# Patient Record
Sex: Male | Born: 1975 | Race: White | Hispanic: No | Marital: Married | Smoking: Current some day smoker
Health system: Southern US, Community
[De-identification: ages and names within clinical notes are randomized; demographics above are authoritative.]

## PROBLEM LIST (undated history)

## (undated) DIAGNOSIS — M51369 Other intervertebral disc degeneration, lumbar region without mention of lumbar back pain or lower extremity pain: Secondary | ICD-10-CM

## (undated) DIAGNOSIS — M5136 Other intervertebral disc degeneration, lumbar region: Secondary | ICD-10-CM

## (undated) DIAGNOSIS — IMO0002 Reserved for concepts with insufficient information to code with codable children: Secondary | ICD-10-CM

## (undated) DIAGNOSIS — M5126 Other intervertebral disc displacement, lumbar region: Secondary | ICD-10-CM

## (undated) DIAGNOSIS — M75100 Unspecified rotator cuff tear or rupture of unspecified shoulder, not specified as traumatic: Secondary | ICD-10-CM

## (undated) DIAGNOSIS — M4800 Spinal stenosis, site unspecified: Secondary | ICD-10-CM

## (undated) HISTORY — PX: ELBOW FRACTURE SURGERY: SHX616

## (undated) HISTORY — PX: ABDOMINAL SURGERY: SHX537

## (undated) HISTORY — PX: SHOULDER SURGERY: SHX246

---

## 2013-09-08 ENCOUNTER — Emergency Department (HOSPITAL_COMMUNITY)
Admission: EM | Admit: 2013-09-08 | Discharge: 2013-09-08 | Disposition: A | Discharge status: Home / Self Care | Payer: Self-pay | Attending: Emergency Medicine | Admitting: Emergency Medicine

## 2013-09-08 ENCOUNTER — Encounter (HOSPITAL_COMMUNITY): Payer: Self-pay | Admitting: Emergency Medicine

## 2013-09-08 DIAGNOSIS — G8929 Other chronic pain: Secondary | ICD-10-CM | POA: Insufficient documentation

## 2013-09-08 DIAGNOSIS — IMO0002 Reserved for concepts with insufficient information to code with codable children: Secondary | ICD-10-CM | POA: Insufficient documentation

## 2013-09-08 HISTORY — DX: Spinal stenosis, site unspecified: M48.00

## 2013-09-08 HISTORY — DX: Other intervertebral disc degeneration, lumbar region without mention of lumbar back pain or lower extremity pain: M51.369

## 2013-09-08 HISTORY — DX: Other intervertebral disc displacement, lumbar region: M51.26

## 2013-09-08 HISTORY — DX: Reserved for concepts with insufficient information to code with codable children: IMO0002

## 2013-09-08 HISTORY — DX: Other intervertebral disc degeneration, lumbar region: M51.36

## 2013-09-08 MED ORDER — OXYCODONE-ACETAMINOPHEN 5-325 MG PO TABS: 1.0000 | ORAL_TABLET | Freq: Four times a day (QID) | ORAL | Status: DC | PRN

## 2013-09-08 MED ORDER — METHOCARBAMOL 750 MG PO TABS: 750.0000 mg | ORAL_TABLET | Freq: Three times a day (TID) | ORAL | Status: DC

## 2013-09-08 MED ORDER — KETOROLAC TROMETHAMINE 30 MG/ML IJ SOLN
30.0000 mg | Freq: Once | INTRAMUSCULAR | Status: AC
  Administered 2013-09-08: 30 mg via INTRAMUSCULAR
  Filled 2013-09-08: qty 1

## 2013-09-08 MED ORDER — OXYCODONE-ACETAMINOPHEN 5-325 MG PO TABS
2.0000 | ORAL_TABLET | Freq: Once | ORAL | Status: AC
  Administered 2013-09-08: 2 via ORAL
  Filled 2013-09-08: qty 2

## 2013-09-08 MED ORDER — METHOCARBAMOL 500 MG PO TABS
750.0000 mg | ORAL_TABLET | Freq: Once | ORAL | Status: AC
  Administered 2013-09-08: 750 mg via ORAL
  Filled 2013-09-08: qty 2

## 2013-09-08 NOTE — ED Provider Notes (Signed)
CSN: [id number]     Arrival date & time 09/08/13  1735 History  This chart was scribed for non-physician practitioner, Earley Favor, FNP,working with Vida Roller, MD, by Karle Plumber, ED Scribe.  This patient was seen in room WTR7/WTR7 and the patient's care was started at 8:05 PM.    Chief Complaint  Patient presents with  . Back Pain   The history is provided by the patient. No language interpreter was used.   HPI Comments:  Robert Spencer is a 37 y.o. male with h/o spinal stenosis and degenerative disc disease who presents to the Emergency Department complaining of excerebration of his chronic lower back pain. He states he last saw his doctor in pain management in [place] approximately three months ago. He is new to the area and needs a referral to the pain management clinic. He states he awakes at times and has pain, numbness and tingling in his legs that  improves once he starts moving around. He reports he sometimes takes Aleve with no relief. Denies trauma. He has not taken any medications today. He denies bowel or bladder incontinence.    Past Medical History  Diagnosis Date  . Spinal stenosis   . Degenerative disc disease, lumbar   . Herniated disc   . Ruptured lumbar disc    Past Surgical History  Procedure Laterality Date  . Elbow fracture surgery Right    No family history on file. History  Substance Use Topics  . Smoking status: Never Smoker   . Smokeless tobacco: Not on file  . Alcohol Use: Yes     Comment: socially    Review of Systems  Constitutional: Negative for chills.  Musculoskeletal: Positive for arthralgias, back pain and gait problem. Negative for joint swelling, myalgias and neck stiffness.  Skin: Negative for rash and wound.  Neurological: Positive for numbness. Negative for dizziness and weakness.  All other systems reviewed and are negative.    Allergies  Review of patient's allergies indicates no known allergies.  Home Medications  No  current outpatient prescriptions on file. Triage Vitals: BP 124/73  Pulse 75  Temp(Src) 98.3 F (36.8 C) (Oral)  Resp 17  SpO2 96% Physical Exam  Nursing note and vitals reviewed. Constitutional: He appears well-developed and well-nourished.  HENT:  Head: Normocephalic.  Eyes: Pupils are equal, round, and reactive to light.  Neck: Normal range of motion.  Cardiovascular: Normal rate and regular rhythm.   Pulmonary/Chest: Effort normal and breath sounds normal.  Musculoskeletal: He exhibits tenderness. He exhibits no edema.  Neurological: He is alert.  Skin: Skin is warm and dry. No erythema.    ED Course  Procedures (including critical care time) DIAGNOSTIC STUDIES: Oxygen Saturation is 96% on RA, adequate by my interpretation.   COORDINATION OF CARE: 8:12 PM- Will give list of resources to establish care with a primary care physician. Will give pain medication, muscle relaxer, and anti-inflammatory medication. Pt verbalizes understanding and agrees to plan.  Labs Review Labs Reviewed - No data to display Imaging Review No results found.  EKG Interpretation   None       MDM  No diagnosis found.   I personally performed the services described in this documentation, which was scribed in my presence. The recorded information has been reviewed and is accurate.   Arman Filter, NP 09/08/13 2033

## 2013-09-08 NOTE — ED Notes (Signed)
Pt from home reports that he has chronic back pain. Pt is from [place] and states that he was a pain clinic pt, but has been here for three mths and has not been seen in [place] because he has been told he needs a referral. Pt adds that when he woke up this am his leg were tingling and numb, but since has resolved. Pt is A&O and in NAD

## 2013-09-09 NOTE — ED Provider Notes (Signed)
Medical screening examination/treatment/procedure(s) were performed by non-physician practitioner and as supervising physician I was immediately available for consultation/collaboration.    Leiyah Maultsby D Edgerrin Correia, MD 09/09/13 1526 

## 2013-11-10 ENCOUNTER — Emergency Department (HOSPITAL_COMMUNITY)
Admission: EM | Admit: 2013-11-10 | Discharge: 2013-11-10 | Disposition: A | Discharge status: Home / Self Care | Payer: Self-pay | Attending: Emergency Medicine | Admitting: Emergency Medicine

## 2013-11-10 ENCOUNTER — Encounter (HOSPITAL_COMMUNITY): Payer: Self-pay | Admitting: Emergency Medicine

## 2013-11-10 DIAGNOSIS — IMO0001 Reserved for inherently not codable concepts without codable children: Secondary | ICD-10-CM | POA: Insufficient documentation

## 2013-11-10 DIAGNOSIS — M545 Low back pain, unspecified: Secondary | ICD-10-CM

## 2013-11-10 DIAGNOSIS — G8929 Other chronic pain: Secondary | ICD-10-CM | POA: Insufficient documentation

## 2013-11-10 DIAGNOSIS — M5412 Radiculopathy, cervical region: Secondary | ICD-10-CM | POA: Insufficient documentation

## 2013-11-10 DIAGNOSIS — M5416 Radiculopathy, lumbar region: Secondary | ICD-10-CM

## 2013-11-10 DIAGNOSIS — R110 Nausea: Secondary | ICD-10-CM | POA: Insufficient documentation

## 2013-11-10 DIAGNOSIS — IMO0002 Reserved for concepts with insufficient information to code with codable children: Secondary | ICD-10-CM | POA: Insufficient documentation

## 2013-11-10 DIAGNOSIS — M5410 Radiculopathy, site unspecified: Secondary | ICD-10-CM

## 2013-11-10 DIAGNOSIS — M542 Cervicalgia: Secondary | ICD-10-CM | POA: Insufficient documentation

## 2013-11-10 MED ORDER — IBUPROFEN 600 MG PO TABS: 600.0000 mg | ORAL_TABLET | Freq: Four times a day (QID) | ORAL | Status: DC | PRN

## 2013-11-10 MED ORDER — OXYCODONE-ACETAMINOPHEN 5-325 MG PO TABS: 1.0000 | ORAL_TABLET | ORAL | Status: DC | PRN

## 2013-11-10 MED ORDER — OXYCODONE-ACETAMINOPHEN 5-325 MG PO TABS
2.0000 | ORAL_TABLET | Freq: Once | ORAL | Status: AC
  Administered 2013-11-10: 2 via ORAL
  Filled 2013-11-10: qty 2

## 2013-11-10 MED ORDER — METHOCARBAMOL 500 MG PO TABS: 500.0000 mg | ORAL_TABLET | Freq: Two times a day (BID) | ORAL | Status: DC | PRN

## 2013-11-10 MED ORDER — DIAZEPAM 5 MG/ML IJ SOLN
5.0000 mg | Freq: Once | INTRAMUSCULAR | Status: AC
  Administered 2013-11-10: 5 mg via INTRAMUSCULAR

## 2013-11-10 MED ORDER — DIAZEPAM 5 MG/ML IJ SOLN
5.0000 mg | Freq: Once | INTRAMUSCULAR | Status: DC
  Filled 2013-11-10: qty 2

## 2013-11-10 MED ORDER — KETOROLAC TROMETHAMINE 60 MG/2ML IM SOLN
60.0000 mg | Freq: Once | INTRAMUSCULAR | Status: AC
  Administered 2013-11-10: 60 mg via INTRAMUSCULAR
  Filled 2013-11-10: qty 2

## 2013-11-10 NOTE — ED Notes (Signed)
Pt c/o back pain w/ hx of same due to injury.

## 2013-11-10 NOTE — ED Provider Notes (Signed)
CSN: [id number]     Arrival date & time 11/10/13  1056 History   First MD Initiated Contact with Patient 11/10/13 1105     Chief Complaint  Patient presents with  . Back Pain   (Consider location/radiation/quality/duration/timing/severity/associated sxs/prior Treatment) HPI Pt is a 37yo male with hx of spinal stenosis and DDD in lumbar region c/o exacerbation of chronic low back pain and now reports right sided neck pain and right arm pain with numbness. States he recently moved from [facility] where he went to pain management, however is not going to have insurance until the new year, has been unable to establish PCP for referral to pain management. 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  Pain is constant, aching with sharp intermittent pain, associated with intermittent right arm numbness and bilateral leg numbness. States he knows he needs surgery at some point but he is scared of the anesthesia part of surgery.  Denies fever, n/v/d. Denies change in bowel or bladder habits.  Pt has asked if we have a TENS unit in ED as he states that has helped greatly in past.  Past Medical History  Diagnosis Date  . Spinal stenosis   . Degenerative disc disease, lumbar   . Herniated disc   . Ruptured lumbar disc    Past Surgical History  Procedure Laterality Date  . Elbow fracture surgery Right    No family history on file. History  Substance Use Topics  . Smoking status: Never Smoker   . Smokeless tobacco: Not on file  . Alcohol Use: Yes     Comment: socially    Review of Systems  Constitutional: Negative for fever and chills.  Gastrointestinal: Positive for nausea. Negative for vomiting, diarrhea and constipation.  Musculoskeletal: Positive for back pain, myalgias and neck pain ( right side). Negative for neck stiffness.  Skin: Negative for rash.  All other systems reviewed and are negative.    Allergies  Review of patient's allergies indicates no known  allergies.  Home Medications   Current Outpatient Rx  Name  Route  Sig  Dispense  Refill  . ibuprofen (ADVIL,MOTRIN) 600 MG tablet   Oral   Take 1 tablet (600 mg total) by mouth every 6 (six) hours as needed.   30 tablet   0   . methocarbamol (ROBAXIN) 500 MG tablet   Oral   Take 1 tablet (500 mg total) by mouth 2 (two) times daily as needed for muscle spasms.   20 tablet   0   . oxyCODONE-acetaminophen (PERCOCET/ROXICET) 5-325 MG per tablet   Oral   Take 1-2 tablets by mouth every 4 (four) hours as needed for severe pain.   15 tablet   0    BP 115/83  Pulse 66  Temp(Src) 97.5 F (36.4 C) (Oral)  Resp 18  SpO2 98% Physical Exam  Nursing note and vitals reviewed. Constitutional: He appears well-developed and well-nourished.  Pt standing in exam room, right arm up with hand on his hip, pt appears uncomfortable.  HENT:  Head: Normocephalic and atraumatic.  Eyes: Conjunctivae are normal. No scleral icterus.  Neck: Normal range of motion.  Cardiovascular: Normal rate, regular rhythm and normal heart sounds.   Pulmonary/Chest: Effort normal and breath sounds normal. No respiratory distress. He has no wheezes. He has no rales. He exhibits no tenderness.  Abdominal: Soft. Bowel sounds are normal. He exhibits no distension and no mass. There is no tenderness. There is no rebound and no  guarding.  Musculoskeletal: Normal range of motion. He exhibits tenderness. He exhibits no edema.  Tenderness in right upper trapezius, pain and numbness with full abduction of right arm. 5/5 grip strength bilaterally. Tenderness along lower lumbar spine and musculature. No step offs or crepitus.   Neurological: He is alert.  Antalgic gait  Skin: Skin is warm and dry.    ED Course  Procedures (including critical care time) Labs Review Labs Reviewed - No data to display Imaging Review No results found.  EKG Interpretation   None       MDM   1. Chronic low back pain   2.  Radiculopathy of lumbar region   3. Radiculopathy of arm    Pt with chronic pain w/o red flag symptoms, recently moved to area.  Pt in process of getting medical insurance for PCP and pain management referral.   Tenderness in right upper trapezius and lower lumbar region. Antalgic gait.Tx in ED: valium, oxycodone, and toradol.  Rx: robaxin, percocet, ibuprofen. Discussed use of TENS unit and advised pt look at local pharmacy and online for home TENS units.  Resource guide provided. Return precautions provided.    Junius Finner, PA-C 11/10/13 1221

## 2013-11-10 NOTE — ED Provider Notes (Signed)
Medical screening examination/treatment/procedure(s) were performed by non-physician practitioner and as supervising physician I was immediately available for consultation/collaboration.  Shanna Cisco, MD 11/10/13 [phone]

## 2013-12-20 ENCOUNTER — Emergency Department (HOSPITAL_COMMUNITY): Payer: Self-pay

## 2013-12-20 ENCOUNTER — Emergency Department (HOSPITAL_COMMUNITY)
Admission: EM | Admit: 2013-12-20 | Discharge: 2013-12-20 | Disposition: A | Discharge status: Home / Self Care | Payer: Self-pay | Attending: Emergency Medicine | Admitting: Emergency Medicine

## 2013-12-20 ENCOUNTER — Encounter (HOSPITAL_COMMUNITY): Payer: Self-pay | Admitting: Emergency Medicine

## 2013-12-20 DIAGNOSIS — G8929 Other chronic pain: Secondary | ICD-10-CM | POA: Insufficient documentation

## 2013-12-20 DIAGNOSIS — M25519 Pain in unspecified shoulder: Secondary | ICD-10-CM | POA: Insufficient documentation

## 2013-12-20 DIAGNOSIS — M25511 Pain in right shoulder: Secondary | ICD-10-CM

## 2013-12-20 DIAGNOSIS — R1110 Vomiting, unspecified: Secondary | ICD-10-CM | POA: Insufficient documentation

## 2013-12-20 DIAGNOSIS — IMO0001 Reserved for inherently not codable concepts without codable children: Secondary | ICD-10-CM | POA: Insufficient documentation

## 2013-12-20 MED ORDER — HYDROCODONE-ACETAMINOPHEN 5-325 MG PO TABS: 1.0000 | ORAL_TABLET | ORAL | Status: DC | PRN

## 2013-12-20 NOTE — ED Notes (Signed)
Pt. Upset stating "This vicodin will not work, I need percocet. That is what they gave me last time." PA notified. No change in patient medications made.

## 2013-12-20 NOTE — Discharge Instructions (Signed)
Take Vicodin for severe pain only. No driving or operating heavy machinery while taking vicodin. This medication may cause drowsiness. Wear sling for comfort. Continue to ice. Follow up with orthopedics.  Shoulder Pain The shoulder is the joint that connects your arms to your body. The bones that form the shoulder joint include the upper arm bone (humerus), the shoulder blade (scapula), and the collarbone (clavicle). The top of the humerus is shaped like a ball and fits into a rather flat socket on the scapula (glenoid cavity). A combination of muscles and strong, fibrous tissues that connect muscles to bones (tendons) support your shoulder joint and hold the ball in the socket. Small, fluid-filled sacs (bursae) are located in different areas of the joint. They act as cushions between the bones and the overlying soft tissues and help reduce friction between the gliding tendons and the bone as you move your arm. Your shoulder joint allows a wide range of motion in your arm. This range of motion allows you to do things like scratch your back or throw a ball. However, this range of motion also makes your shoulder more prone to pain from overuse and injury. Causes of shoulder pain can originate from both injury and overuse and usually can be grouped in the following four categories:  Redness, swelling, and pain (inflammation) of the tendon (tendinitis) or the bursae (bursitis).  Instability, such as a dislocation of the joint.  Inflammation of the joint (arthritis).  Broken bone (fracture). HOME CARE INSTRUCTIONS   Apply ice to the sore area.  Put ice in a plastic bag.  Place a towel between your skin and the bag.  Leave the ice on for 15-20 minutes, 03-04 times per day for the first 2 days.  Stop using cold packs if they do not help with the pain.  If you have a shoulder sling or immobilizer, wear it as long as your caregiver instructs. Only remove it to shower or bathe. Move your arm as  little as possible, but keep your hand moving to prevent swelling.  Squeeze a soft ball or foam pad as much as possible to help prevent swelling.  Only take over-the-counter or prescription medicines for pain, discomfort, or fever as directed by your caregiver. SEEK MEDICAL CARE IF:   Your shoulder pain increases, or new pain develops in your arm, hand, or fingers.  Your hand or fingers become cold and numb.  Your pain is not relieved with medicines. SEEK IMMEDIATE MEDICAL CARE IF:   Your arm, hand, or fingers are numb or tingling.  Your arm, hand, or fingers are significantly swollen or turn white or blue. MAKE SURE YOU:   Understand these instructions.  Will watch your condition.  Will get help right away if you are not doing well or get worse. Document Released: 08/25/2005 Document Revised: 08/09/2012 Document Reviewed: 10/30/2011 [facility] Patient Information 2014 [employer].

## 2013-12-20 NOTE — ED Notes (Signed)
Pt reports history of R shoulder pain. Denies any known injuries to the shoulder. He tried acupuncture with no relief. Reports pain persists and is getting worse. Cms intact

## 2013-12-20 NOTE — ED Provider Notes (Signed)
CSN: [id number]     Arrival date & time 12/20/13  1551 History  This chart was scribed for non-physician practitioner Johnnette Gourdobyn Albert, PA-C, working with Joya Gaskinsonald W Wickline, MD by Ronal Fearuke Okeke, ED scribe. This patient was seen in room TR07C/TR07C and the patient's care was started at 5:15 PM.    Chief Complaint  Patient presents with  . Shoulder Pain   (Consider location/radiation/quality/duration/timing/severity/associated sxs/prior Treatment) Patient is a 38 y.o. male presenting with shoulder pain. The history is provided by the patient. No language interpreter was used.  Shoulder Pain   HPI Comments: Robert Spencer is a 38 y.o. male who presents to the Emergency Department complaining of chronic right shoulder pain that has grown worse over the past 4x days. Pt's pain is keeping him from sleep and has caused him to experience an episode of emesis.  Pt states that when he leans forward his right arm goes numb. Pt states that he cannot recall any specific injury to the affected area.  Pt has taken ibuprofen and Advil with no relief. Also got acupuncture in the past with mild relief. Pt was [place] by [place] doctor in [place] and advised to get an MRI. 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    Past Medical History  Diagnosis Date  . Spinal stenosis   . Degenerative disc disease, lumbar   . Herniated disc   . Ruptured lumbar disc    Past Surgical History  Procedure Laterality Date  . Elbow fracture surgery Right    History reviewed. No pertinent family history. History  Substance Use Topics  . Smoking status: Never Smoker   . Smokeless tobacco: Not on file  . Alcohol Use: Yes     Comment: socially    Review of Systems  Musculoskeletal: Positive for myalgias. Negative for back pain and neck pain.  All other systems reviewed and are negative.    Allergies  Review of patient's allergies indicates no known allergies.  Home Medications   Current  Outpatient Rx  Name  Route  Sig  Dispense  Refill  . Aspirin-Salicylamide-Caffeine (BC HEADACHE POWDER PO)   Oral   Take 1 packet by mouth daily as needed (for pain).         Marland Kitchen. ibuprofen (ADVIL,MOTRIN) 200 MG tablet   Oral   Take 800 mg by mouth every 6 (six) hours as needed for mild pain.         Marland Kitchen. HYDROcodone-acetaminophen (NORCO/VICODIN) 5-325 MG per tablet   Oral   Take 1-2 tablets by mouth every 4 (four) hours as needed.   10 tablet   0    BP 121/84  Pulse 49  Temp(Src) 97.5 F (36.4 C) (Oral)  Resp 16  Wt 218 lb 12.8 oz (99.247 kg)  SpO2 98% Physical Exam  Nursing note and vitals reviewed. Constitutional: He is oriented to person, place, and time. He appears well-developed and well-nourished. No distress.  HENT:  Head: Normocephalic and atraumatic.  Eyes: Conjunctivae and EOM are normal.  Neck: Normal range of motion. Neck supple.  Cardiovascular: Normal rate, regular rhythm and normal heart sounds.   Pulmonary/Chest: Effort normal and breath sounds normal.  Musculoskeletal: Normal range of motion. He exhibits tenderness. He exhibits no edema.  Tender to palpation over entire right should girdle; no deformity; decreased ROM due to pain  Neurological: He is alert and oriented to person, place, and time.  Sensation intact; normal grip strength; intact distal pulses  Skin: Skin  is warm and dry.  Psychiatric: He has a normal mood and affect. His behavior is normal.    ED Course  Procedures (including critical care time)  DIAGNOSTIC STUDIES: Oxygen Saturation is 98% on RA, normal by my interpretation.    COORDINATION OF CARE: 5:18 PM- Pt advised of plan for treatment including a sling for the right arm and pain medication. Pt will get a referral to an orthopedist and advised to get an MRI for an orthopedist and pt agrees.    Labs Review Labs Reviewed - No data to display Imaging Review Dg Shoulder Right  12/20/2013   CLINICAL DATA:  Pain.  EXAM: RIGHT  SHOULDER - 2+ VIEW  COMPARISON:  None.  FINDINGS: There is no evidence of fracture or dislocation. There is no evidence of arthropathy or other focal bone abnormality. Soft tissues are unremarkable.  IMPRESSION: Negative.   Electronically Signed   By: Maisie Fus  Register   On: 12/20/2013 17:11    EKG Interpretation   None       MDM   1. Shoulder pain, right    Neurovascularly intact. Probable rotator cuff injury. Has been seen for same in past, advised MRI. Sling given, pain contol, f/u with ortho. Return precautions given. Patient states understanding of treatment care plan and is agreeable.   I personally performed the services described in this documentation, which was scribed in my presence. The recorded information has been reviewed and is accurate.    Trevor Mace, PA-C 12/20/13 1727

## 2013-12-21 NOTE — ED Provider Notes (Signed)
Medical screening examination/treatment/procedure(s) were performed by non-physician practitioner and as supervising physician I was immediately available for consultation/collaboration.  EKG Interpretation   None         Joya Gaskinsonald W Eiko Mcgowen, MD 12/21/13 1118

## 2013-12-22 ENCOUNTER — Emergency Department (HOSPITAL_COMMUNITY)
Admission: EM | Admit: 2013-12-22 | Discharge: 2013-12-22 | Disposition: A | Discharge status: Home / Self Care | Payer: Self-pay | Attending: Emergency Medicine | Admitting: Emergency Medicine

## 2013-12-22 ENCOUNTER — Encounter (HOSPITAL_COMMUNITY): Payer: Self-pay | Admitting: Emergency Medicine

## 2013-12-22 DIAGNOSIS — R209 Unspecified disturbances of skin sensation: Secondary | ICD-10-CM | POA: Insufficient documentation

## 2013-12-22 DIAGNOSIS — M25511 Pain in right shoulder: Secondary | ICD-10-CM

## 2013-12-22 DIAGNOSIS — M25519 Pain in unspecified shoulder: Secondary | ICD-10-CM | POA: Insufficient documentation

## 2013-12-22 DIAGNOSIS — R110 Nausea: Secondary | ICD-10-CM | POA: Insufficient documentation

## 2013-12-22 MED ORDER — OXYCODONE-ACETAMINOPHEN 5-325 MG PO TABS: 2.0000 | ORAL_TABLET | ORAL | Status: DC | PRN

## 2013-12-22 MED ORDER — KETOROLAC TROMETHAMINE 60 MG/2ML IM SOLN
60.0000 mg | Freq: Once | INTRAMUSCULAR | Status: AC
  Administered 2013-12-22: 60 mg via INTRAMUSCULAR
  Filled 2013-12-22: qty 2

## 2013-12-22 MED ORDER — NAPROXEN SODIUM 220 MG PO TABS: 220.0000 mg | ORAL_TABLET | Freq: Two times a day (BID) | ORAL | Status: DC

## 2013-12-22 MED ORDER — CYCLOBENZAPRINE HCL 5 MG PO TABS: 5.0000 mg | ORAL_TABLET | Freq: Three times a day (TID) | ORAL | Status: DC | PRN

## 2013-12-22 MED ORDER — HYDROMORPHONE HCL PF 1 MG/ML IJ SOLN
1.0000 mg | Freq: Once | INTRAMUSCULAR | Status: AC
  Administered 2013-12-22: 1 mg via INTRAMUSCULAR
  Filled 2013-12-22: qty 1

## 2013-12-22 NOTE — Discharge Instructions (Signed)
Adhesive Capsulitis Sometimes the shoulder becomes stiff and is painful to move. Some people say it feels as if the shoulder is frozen in place. Because of this, the condition is called "frozen shoulder." Its medical name is adhesive capsulitis.  The shoulder joint is made up of strong connective tissue that attaches the ball of the humerus to the shallow shoulder socket. This strong connective tissue is called the joint capsule. This tissue can become stiff and swollen. That is when adhesive capsulitis sets in. CAUSES  It is not always clear just what the cause adhesive capsulitis. Possibilities include:  Injury to the shoulder joint.  Strain. This is a repetitive injury brought about by overuse.  Lack of use. Perhaps your arm or hand was otherwise injured. It might have been in a sling for awhile. Or perhaps you were not using it to avoid pain.  Referred pain. This is a sort of trick the body plays. You feel pain in the shoulder. But, the pain actually comes from an injury somewhere else in the body.  Long-standing health problems. Several diseases can cause adhesive capsulitis. They include diabetes, heart disease, stroke, thyroid problems, rheumatoid arthritis and lung disease.  Being a women older than 40. Anyone can develop adhesive capsulitis but it is most common in women in this age group. SYMPTOMS   Pain.  It occurs when the arm is moved.  Parts of the shoulder might hurt if they are touched.  Pain is worse at night or when resting.  Soreness. It might not be strong enough to be called pain. But, the shoulder aches.  The shoulder does not move freely.  Muscle spasms.  Trouble sleeping because of shoulder ache or pain. DIAGNOSIS  To decide if you have adhesive capsulitis, your healthcare provider will probably:  Ask about symptoms you have noticed.  Ask about your history of joint pain and anything that might have caused the pain.  Ask about your overall  health.  Use hands to feel your shoulder and neck.  Ask you to move your shoulder in specific directions. This may indicate the origin of the pain.  Order imaging tests; pictures of the shoulder. They help pinpoint the source of the problem. An X-ray might be used. For more detail, an MRI is often used. An MRI details the tendons, muscles and ligaments as well as the joint. TREATMENT  Adhesive capsulitis can be treated several ways. Most treatments can be done in a clinic or in your healthcare provider's office. Be sure to discuss the different options with your caregiver. They include:  Physical therapy. You will work on specific exercises to get your shoulder moving again. The exercises usually involve stretching. A physical therapist (a caregiver with special training) can show you what to do and what not to do. The exercises will need to be done daily.  Medication.  Over-the-counter medicines may relieve pain and inflammation (the body's way of reacting to injury or infection).  Corticosteroids. These are stronger drugs to reduce pain and inflammation. They are given by injection (shots) into the shoulder joint. Frequent treatment is not recommended.  Muscle relaxants. Medication may be prescribed to ease muscle spasms.  Treatment of underlying conditions. This means treating another condition that is causing your shoulder problem. This might be a rotator cuff (tendon) problem  Shoulder manipulation. The shoulder will be moved by your healthcare provider. You would be under general anesthesia (given a drug that puts you to sleep). You would not feel anything. Sometimes   the joint will be injected with salt water (saline) at high pressure to break down internal scarring in the joint capsule.  Surgery. This is rarely needed. It may be suggested in advanced cases after all other treatment has failed. PROGNOSIS  In time, most people recover from adhesive capsulitis. Sometimes, however, the  pain goes away but full movement of the shoulder does not return.  HOME CARE INSTRUCTIONS   Take any pain medications recommended by your healthcare provider. Follow the directions carefully.  If you have physical therapy, follow through with the therapist's suggestions. Be sure you understand the exercises you will be doing. You should understand:  How often the exercises should be done.  How many times each exercise should be repeated.  How long they should be done.  What other activities you should do, or not do.  That you should warm up before doing any exercise. Just 5 to 10 minutes will help. Small, gentle movements should get your shoulder ready for more.  Avoid high-demand exercise that involves your shoulder such as throwing. This type of exercise can make pain worse.  Consider using cold packs. Cold may ease swelling and pain. Ask your healthcare provider if a cold pack might help you. If so, get directions on how and when to use them. SEEK MEDICAL CARE IF:   You have any questions about your medications.  Your pain continues to increase. Document Released: 09/12/2009 Document Revised: 02/07/2012 Document Reviewed: 09/12/2009 [facility] Patient Information 2014 [place], [place].  Shoulder Pain The shoulder is the joint that connects your arms to your body. The bones that form the shoulder joint include the upper arm bone (humerus), the shoulder blade (scapula), and the collarbone (clavicle). The top of the humerus is shaped like a ball and fits into a rather flat socket on the scapula (glenoid cavity). A combination of muscles and strong, fibrous tissues that connect muscles to bones (tendons) support your shoulder joint and hold the ball in the socket. Small, fluid-filled sacs (bursae) are located in different areas of the joint. They act as cushions between the bones and the overlying soft tissues and help reduce friction between the gliding tendons and the bone as you move your  arm. Your shoulder joint allows a wide range of motion in your arm. This range of motion allows you to do things like scratch your back or throw a ball. However, this range of motion also makes your shoulder more prone to pain from overuse and injury. Causes of shoulder pain can originate from both injury and overuse and usually can be grouped in the following four categories:  Redness, swelling, and pain (inflammation) of the tendon (tendinitis) or the bursae (bursitis).  Instability, such as a dislocation of the joint.  Inflammation of the joint (arthritis).  Broken bone (fracture). HOME CARE INSTRUCTIONS   Apply ice to the sore area.  Put ice in a plastic bag.  Place a towel between your skin and the bag.  Leave the ice on for 15-20 minutes, 03-04 times per day for the first 2 days.  Stop using cold packs if they do not help with the pain.  If you have a shoulder sling or immobilizer, wear it as long as your caregiver instructs. Only remove it to shower or bathe. Move your arm as little as possible, but keep your hand moving to prevent swelling.  Squeeze a soft ball or foam pad as much as possible to help prevent swelling.  Only take over-the-counter or prescription  medicines for pain, discomfort, or fever as directed by your caregiver. SEEK MEDICAL CARE IF:   Your shoulder pain increases, or new pain develops in your arm, hand, or fingers.  Your hand or fingers become cold and numb.  Your pain is not relieved with medicines. SEEK IMMEDIATE MEDICAL CARE IF:   Your arm, hand, or fingers are numb or tingling.  Your arm, hand, or fingers are significantly swollen or turn white or blue. MAKE SURE YOU:   Understand these instructions.  Will watch your condition.  Will get help right away if you are not doing well or get worse. Document Released: 08/25/2005 Document Revised: 08/09/2012 Document Reviewed: 10/30/2011 [facility] Patient Information 2014 [place],  [place].   Emergency Department Resource Guide 1) Find a Doctor and Pay Out of Pocket Although you won't have to find out who is covered by your insurance plan, it is a good idea to ask around and get recommendations. You will then need to call the office and see if the doctor you have chosen will accept you as a new patient and what types of options they offer for patients who are self-pay. Some doctors offer discounts or will set up payment plans for their patients who do not have insurance, but you will need to ask so you aren't surprised when you get to your appointment.  2) Contact Your Local Health Department Not all health departments have doctors that can see patients for sick visits, but many do, so it is worth a call to see if yours does. If you don't know where your local health department is, you can check in your phone book. The CDC also has a tool to help you locate your state's health department, and many state websites also have listings of all of their local health departments.  3) Find a Walk-in Clinic If your illness is not likely to be very severe or complicated, you may want to try a walk in clinic. These are popping up all over the country in pharmacies, drugstores, and shopping centers. They're usually staffed by nurse practitioners or physician assistants that have been trained to treat common illnesses and complaints. They're usually fairly quick and inexpensive. However, if you have serious medical issues or chronic medical problems, these are probably not your best option.  No Primary Care Doctor: - Call Health Connect at  [phone] - they can help you locate a primary care doctor that  accepts your insurance, provides certain services, etc. - Physician Referral Service- [phone]  Chronic Pain Problems: Organization         Address  Phone   Notes  [facility]  [phone] Patients need to be referred by their primary care doctor.   Medication  Assistance: Organization         Address  Phone   Notes  [facility] Medication [facility] [address] [place], [place] [address] [phone] --Must be a resident of [facility] -- Must have NO insurance coverage whatsoever (no Medicaid/ Medicare, etc.) -- The pt. MUST have a primary care doctor that directs their care regularly and follows them in the community   [facility]  [phone]   [employer]  [phone]    Agencies that provide inexpensive medical care: Organization         Address  Phone   Notes  [facility]  [phone]   [facility]    [phone]  [facility] [address] [place], [place] [address] [phone]   [facility] [address] [place] [place] [phone]   Planned Parenthood    [phone]   [facility]    [phone]   [facility]  [address], [place] Phone:  [phone], Fax:  [phone] Hours of Operation:  9 am - 6 pm, M-F.  Also accepts Medicaid/Medicare and self-pay.  [facility]  [address], [place] Phone: [phone], Fax: [phone]. Hours of Operation:  8:30 am - 5:30 pm, M-F.  Also accepts Medicaid and self-pay.  [facility] [place] [address] [place] Phone: [phone]   [facility] [address] Natasha Bence [place], [place] [phone], Ext. [phone] Mondays & Thursdays: 7-9 AM.  First 15 patients are seen on a first come, first serve basis.    Medicaid-accepting [facility] Providers:  Organization         Address  Phone   Notes  [facility] [address], [place] [phone] Also accepts self-pay patients.  [facility] [address] Laurell Josephs [place], [place]  [phone]   [facility] [address], [place]  [phone]   [facility] [address] [place] [phone]   Renaye Rakers [address], [place]   [phone] Only accepts [place] patients after they have their name applied to their card.   Self-Pay (no insurance) in [facility]:  Organization         Address  Phone   Notes  Sickle Cell Patients, [facility] [address] [place], [place] [phone]   [facility] [address] [place], [place] [phone]   [facility] [place]  [address], [place] [phone]   [facility]/Dr. Osei-Bonsu  [address], [place] or [address], [place] [phone] Phone number for both [place] and [place] locations is the same.  Urgent Medical and [facility] [address] [place] [phone]   [facility] [address], [place] or [address] [phone]   [facility] [address] [place] [phone], phone; [phone], fax Sees patients 1st and 3rd Saturday of every month.  Must not qualify for public or private insurance (i.e. Medicaid, Medicare, [place] Health Choice, Veterans' Benefits)  Household income should be no more than 200% of the poverty level The clinic cannot treat you if you are pregnant or think you are pregnant  Sexually transmitted diseases are not treated at the clinic.    Dental Care: Organization         Address  Phone  Notes  [facility] Department of [facility] [address] [place], [place] [phone] Accepts children up to age 34 who are enrolled in [place] or [place] Health Choice; pregnant women with a Medicaid card; and children who have applied for Medicaid or [place] Health Choice, but were declined, whose parents can pay a reduced fee at time of service.  [facility]  Department of [facility]  [address], [place] [phone] Accepts children up to age 22 who  are enrolled in Medicaid or [place] Health Choice; pregnant women with a Medicaid card; and children who have applied for Medicaid or [place] Health Choice, but were declined, whose parents can pay a reduced fee at time of service.  [facility]  [address] [place], [place] [phone] Patients are seen by appointment only. Walk-ins are not accepted. [facility] will see patients 55 years of age and older. Monday - Tuesday (8am-5pm) Most Wednesdays (8:30-5pm) $30 per visit, cash only  [facility] Adult [facility] Access [facility]  [address], [facility] [phone] Patients are seen by appointment only. Walk-ins are not accepted. [facility] will see patients 3 years of age and older. One Wednesday Evening (Monthly: Volunteer Based).  $30 per visit, cash only  [employer]  [phone] for adults; Children under age 52, call Graduate Pediatric Dentistry at [phone]. Children aged 14-14, please call [phone] to request a pediatric application.  Dental services are provided in all areas of dental care including fillings, crowns and bridges, complete and partial dentures, implants, gum treatment, root canals, and extractions. Preventive care is also provided. Treatment is provided to both adults and children. Patients are selected via a lottery and there is often a waiting list.   [facility] [address], [place]  [phone] [email or web address]   [facility] [address] [place], [place] [phone], Ext. [phone] Second and Fourth Thursday of each month, opens at 6:30 AM; Clinic ends at 9 AM.  Patients are seen on a first-come first-served basis, and a limited number are seen during each clinic.   [facility]  [address] Ether Griffins [place], [place] [phone]    Eligibility Requirements You must have lived in [place], [place], or [place] counties for at least the last three months.   You cannot be eligible for state or federal sponsored [place], including [employer], [place], or [employer].   You generally cannot be eligible for healthcare insurance through your employer.    How to apply: Eligibility screenings are held every Tuesday and Wednesday afternoon from 1:00 pm until 4:00 pm. You do not need an appointment for the interview!  [facility] [address], [place], [place] [phone]   [facility] Health Department  [phone]   [facility] Health Department  [phone]   [facility] Department  [phone]    Behavioral Health Resources in the Community: Intensive Outpatient Programs Organization         Address  Phone  Notes  [facility] [address], [place], [place] [phone]   [facility] Outpatient [address] [place], [place] [phone]   [facility] [address], [place], [place]  [phone]   [facility] Mental Health [address]  [place], [place] [phone] or [phone]   Substance Abuse Resources Organization         Address  Phone  Notes  [facility]  [phone]   [facility]  [phone]   [facility]  [phone]   Floydene Flock  [phone]   Residential & Outpatient Substance Abuse Program  [phone]   Psychological Services Organization         Address  Phone  Notes  [facility] Behavioral Health  [phone]   [facility]  [phone]   [facility] Mental Health [address], [place]  [phone] or [phone]    Mobile Crisis Teams Organization         Address  Phone  Notes  [facility], Mobile Crisis Care Unit  [phone]   [facility]  [address]  [place], [place] [phone]   [facility] [address] [place] [phone]    Self-Help/Support Groups Organization         Address  Phone             Notes  [facility] [place] - variety of support groups  336- I7437963 Call for more information  Narcotics Anonymous (NA), [facility] [address], [employer] [place]  2 meetings at this location   Statistician         Address  Phone  Notes  [facility] [address],    [place]  [phone]   [facility]  [address] [place] [address], [place], [place] [phone]   [facility] [address] [place], [place] [phone] Admissions: 8am-3pm M-F  [facility] [address],    [place], [place] [phone]   [facility] [address] [place], [place], [place] [phone]   [facility] [address]  [place], [place] [phone]   [facility] - Intensive Outpatient [address] Laurell Josephs [address], [place], [place] [phone]   [facility] ([facility] [address] [place]  [place], [place] [phone] or [phone]   [facility] ([facility]) [address], [place], [place] [phone] Accepts Medicaid  [facility] [address]  [place] [phone] Substance Abuse/Addiction Treatment   [facility] Organization         Address  Phone  Notes  [facility]  [phone]   Angie Fava, PhD [address] Ervin Knack [place], [place]   [phone] or [phone]   [facility] Behavioral   [address] [place], [place] [phone]   [facility] [address] [place] [address], [place], [place] [phone] Insurance/Medicaid/sponsorship through [facility] and Families [address]                                    [place], [place] [phone] Therapy/tele-psych/case    [facility] [address], [place] [phone]    Dr. Lolly Mustache  [phone]   [facility]  [employer] [facility] Dept. 1) [address], [place] 2) [address] [place] 3)  [address], [place] [phone]  [phone]   [facility] Child Abuse Hotline (484)254-5575 or [phone] (After Hours)

## 2013-12-22 NOTE — ED Provider Notes (Signed)
CSN: [id number]     Arrival date & time 12/22/13  1130 History   First MD Initiated Contact with Patient 12/22/13 1209     Chief Complaint  Patient presents with  . Shoulder Pain   (Consider location/radiation/quality/duration/timing/severity/associated sxs/prior Treatment) Patient is a 38 y.o. male presenting with shoulder pain.  Shoulder Pain This is a new problem. Episode onset: 1 week. The problem occurs constantly. The problem has been gradually worsening. Pertinent negatives include no chest pain and no shortness of breath. Associated symptoms comments: No fevers.  Positional tingling in right hand when leaning forward.. Exacerbated by: movement. Nothing relieves the symptoms. Treatments tried: vicodin, ibuprofen. The treatment provided no relief.    Past Medical History  Diagnosis Date  . Spinal stenosis   . Degenerative disc disease, lumbar   . Herniated disc   . Ruptured lumbar disc    Past Surgical History  Procedure Laterality Date  . Elbow fracture surgery Right    History reviewed. No pertinent family history. History  Substance Use Topics  . Smoking status: Never Smoker   . Smokeless tobacco: Not on file  . Alcohol Use: Yes     Comment: socially    Review of Systems  Constitutional: Negative for fever.  Respiratory: Negative for cough and shortness of breath.   Cardiovascular: Negative for chest pain.  Gastrointestinal: Positive for nausea.  All other systems reviewed and are negative.    Allergies  Review of patient's allergies indicates no known allergies.  Home Medications   Current Outpatient Rx  Name  Route  Sig  Dispense  Refill  . Aspirin-Salicylamide-Caffeine (BC HEADACHE POWDER PO)   Oral   Take 1 packet by mouth daily as needed (for pain).         Marland Kitchen HYDROcodone-acetaminophen (NORCO/VICODIN) 5-325 MG per tablet   Oral   Take 1-2 tablets by mouth every 4 (four) hours as needed.   10 tablet   0   . ibuprofen (ADVIL,MOTRIN) 200 MG  tablet   Oral   Take 800 mg by mouth every 6 (six) hours as needed for mild pain.          BP 127/80  Pulse 73  Temp(Src) 97.7 F (36.5 C) (Oral)  Resp 18  SpO2 96% Physical Exam  Nursing note and vitals reviewed. Constitutional: He is oriented to person, place, and time. He appears well-developed and well-nourished. No distress.  HENT:  Head: Normocephalic and atraumatic.  Eyes: Conjunctivae are normal. No scleral icterus.  Neck: Neck supple.  Cardiovascular: Normal rate and intact distal pulses.   Pulmonary/Chest: Effort normal. No stridor. No respiratory distress.  Abdominal: Normal appearance. He exhibits no distension.  Musculoskeletal:       Right shoulder: He exhibits tenderness. He exhibits normal range of motion (pain with active and passive ROM), no swelling, no effusion, no crepitus, no deformity, no laceration, normal pulse and normal strength.  Neurological: He is alert and oriented to person, place, and time.  Skin: Skin is warm and dry. No rash noted.  Psychiatric: He has a normal mood and affect. His behavior is normal.    ED Course  Procedures (including critical care time) Labs Review Labs Reviewed - No data to display Imaging Review Dg Shoulder Right  12/20/2013   CLINICAL DATA:  Pain.  EXAM: RIGHT SHOULDER - 2+ VIEW  COMPARISON:  None.  FINDINGS: There is no evidence of fracture or dislocation. There is no evidence of arthropathy or other focal bone abnormality. Soft tissues  are unremarkable.  IMPRESSION: Negative.   Electronically Signed   By: Maisie Fushomas  Register   On: 12/20/2013 17:11    EKG Interpretation   None       MDM   1. Right shoulder pain    38 yo male with right shoulder pain.  No known injury, but has had off and on pain in this shoulder in the past.  Evaluated 2 days ago in ED and had negative x rays.  Pain appears musculoskeletal.  Good pulses, good strength, good sensation distally.  No evidence of septic joint.  Given IM dilaudid and  IM toradol.  Will dc with percocet, NSAIDs, and other supportive care until he can see an orthopedist.  Also rec'd outpt PT eval.      Candyce ChurnJohn David Deborha Moseley, MD 12/22/13 [phone]

## 2013-12-22 NOTE — ED Notes (Signed)
Pt from home c/o shoulder pain, denies injury. [facility] same. He reports having spasms and his pain is uncontrolled with Vicodin perscribed.

## 2014-02-26 ENCOUNTER — Emergency Department (INDEPENDENT_AMBULATORY_CARE_PROVIDER_SITE_OTHER)
Admission: EM | Admit: 2014-02-26 | Discharge: 2014-02-26 | Disposition: A | Discharge status: Home / Self Care | Payer: Self-pay | Source: Home / Self Care

## 2014-02-26 ENCOUNTER — Encounter (HOSPITAL_COMMUNITY): Payer: Self-pay | Admitting: Emergency Medicine

## 2014-02-26 DIAGNOSIS — S39012A Strain of muscle, fascia and tendon of lower back, initial encounter: Secondary | ICD-10-CM

## 2014-02-26 DIAGNOSIS — M25519 Pain in unspecified shoulder: Secondary | ICD-10-CM

## 2014-02-26 DIAGNOSIS — M25511 Pain in right shoulder: Secondary | ICD-10-CM

## 2014-02-26 DIAGNOSIS — G8929 Other chronic pain: Secondary | ICD-10-CM

## 2014-02-26 DIAGNOSIS — S335XXA Sprain of ligaments of lumbar spine, initial encounter: Secondary | ICD-10-CM

## 2014-02-26 MED ORDER — DICLOFENAC POTASSIUM 50 MG PO TABS: 50.0000 mg | ORAL_TABLET | Freq: Three times a day (TID) | ORAL | Status: DC

## 2014-02-26 MED ORDER — HYDROCODONE-ACETAMINOPHEN 5-325 MG PO TABS: 1.0000 | ORAL_TABLET | ORAL | Status: DC | PRN

## 2014-02-26 NOTE — Discharge Instructions (Signed)
Chronic Pain Chronic pain can be defined as pain that is off and on and lasts for 3 6 months or longer. Many things cause chronic pain, which can make it difficult to make a diagnosis. There are many treatment options available for chronic pain. However, finding a treatment that works well for you may require trying various approaches until the right one is found. Many people benefit from a combination of two or more types of treatment to control their pain. SYMPTOMS  Chronic pain can occur anywhere in the body and can range from mild to very severe. Some types of chronic pain include:  Headache.  Low back pain.  Cancer pain.  Arthritis pain.  Neurogenic pain. This is pain resulting from damage to nerves. People with chronic pain may also have other symptoms such as:  Depression.  Anger.  Insomnia.  Anxiety. DIAGNOSIS  Your health care provider will help diagnose your condition over time. In many cases, the initial focus will be on excluding possible conditions that could be causing the pain. Depending on your symptoms, your health care provider may order tests to diagnose your condition. Some of these tests may include:   Blood tests.   CT scan.   MRI.   X-rays.   Ultrasounds.   Nerve conduction studies.  You may need to see a specialist.  TREATMENT  Finding treatment that works well may take time. You may be referred to a pain specialist. He or she may prescribe medicine or therapies, such as:   Mindful meditation or yoga.  Shots (injections) of numbing or pain-relieving medicines into the spine or area of pain.  Local electrical stimulation.  Acupuncture.   Massage therapy.   Aroma, color, light, or sound therapy.   Biofeedback.   Working with a physical therapist to keep from getting stiff.   Regular, gentle exercise.   Cognitive or behavioral therapy.   Group support.  Sometimes, surgery may be recommended.  HOME CARE INSTRUCTIONS    Take all medicines as directed by your health care provider.   Lessen stress in your life by relaxing and doing things such as listening to calming music.   Exercise or be active as directed by your health care provider.   Eat a healthy diet and include things such as vegetables, fruits, fish, and lean meats in your diet.   Keep all follow-up appointments with your health care provider.   Attend a support group with others suffering from chronic pain. SEEK MEDICAL CARE IF:   Your pain gets worse.   You develop a new pain that was not there before.   You cannot tolerate medicines given to you by your health care provider.   You have new symptoms since your last visit with your health care provider.  SEEK IMMEDIATE MEDICAL CARE IF:   You feel weak.   You have decreased sensation or numbness.   You lose control of bowel or bladder function.   Your pain suddenly gets much worse.   You develop shaking.  You develop chills.  You develop confusion.  You develop chest pain.  You develop shortness of breath.  MAKE SURE YOU:  Understand these instructions.  Will watch your condition.  Will get help right away if you are not doing well or get worse. Document Released: 08/07/2002 Document Revised: 07/18/2013 Document Reviewed: 05/11/2013 [facility] Patient Information 2014 [place], [place].  Lumbosacral Strain Lumbosacral strain is a strain of any of the parts that make up your lumbosacral vertebrae. Your  lumbosacral vertebrae are the bones that make up the lower third of your backbone. Your lumbosacral vertebrae are held together by muscles and tough, fibrous tissue (ligaments).  CAUSES  A sudden blow to your back can cause lumbosacral strain. Also, anything that causes an excessive stretch of the muscles in the low back can cause this strain. This is typically seen when people exert themselves strenuously, fall, lift heavy objects, bend, or crouch  repeatedly. RISK FACTORS  Physically demanding work.  Participation in pushing or pulling sports or sports that require sudden twist of the back (tennis, golf, baseball).  Weight lifting.  Excessive lower back curvature.  Forward-tilted pelvis.  Weak back or abdominal muscles or both.  Tight hamstrings. SIGNS AND SYMPTOMS  Lumbosacral strain may cause pain in the area of your injury or pain that moves (radiates) down your leg.  DIAGNOSIS Your health care provider can often diagnose lumbosacral strain through a physical exam. In some cases, you may need tests such as X-ray exams.  TREATMENT  Treatment for your lower back injury depends on many factors that your clinician will have to evaluate. However, most treatment will include the use of anti-inflammatory medicines. HOME CARE INSTRUCTIONS   Avoid hard physical activities (tennis, racquetball, waterskiing) if you are not in proper physical condition for it. This may aggravate or create problems.  If you have a back problem, avoid sports requiring sudden body movements. Swimming and walking are generally safer activities.  Maintain good posture.  Maintain a healthy weight.  For acute conditions, you may put ice on the injured area.  Put ice in a plastic bag.  Place a towel between your skin and the bag.  Leave the ice on for 20 minutes, 2 3 times a day.  When the low back starts healing, stretching and strengthening exercises may be recommended. SEEK MEDICAL CARE IF:  Your back pain is getting worse.  You experience severe back pain not relieved with medicines. SEEK IMMEDIATE MEDICAL CARE IF:   You have numbness, tingling, weakness, or problems with the use of your arms or legs.  There is a change in bowel or bladder control.  You have increasing pain in any area of the body, including your belly (abdomen).  You notice shortness of breath, dizziness, or feel faint.  You feel sick to your stomach (nauseous), are  throwing up (vomiting), or become sweaty.  You notice discoloration of your toes or legs, or your feet get very cold. MAKE SURE YOU:   Understand these instructions.  Will watch your condition.  Will get help right away if you are not doing well or get worse. Document Released: 08/25/2005 Document Revised: 09/05/2013 Document Reviewed: 07/04/2013 [facility] Patient Information 2014 [place], [place].  Shoulder Pain The shoulder is the joint that connects your arms to your body. The bones that form the shoulder joint include the upper arm bone (humerus), the shoulder blade (scapula), and the collarbone (clavicle). The top of the humerus is shaped like a ball and fits into a rather flat socket on the scapula (glenoid cavity). A combination of muscles and strong, fibrous tissues that connect muscles to bones (tendons) support your shoulder joint and hold the ball in the socket. Small, fluid-filled sacs (bursae) are located in different areas of the joint. They act as cushions between the bones and the overlying soft tissues and help reduce friction between the gliding tendons and the bone as you move your arm. Your shoulder joint allows a wide range of motion in  your arm. This range of motion allows you to do things like scratch your back or throw a ball. However, this range of motion also makes your shoulder more prone to pain from overuse and injury. Causes of shoulder pain can originate from both injury and overuse and usually can be grouped in the following four categories:  Redness, swelling, and pain (inflammation) of the tendon (tendinitis) or the bursae (bursitis).  Instability, such as a dislocation of the joint.  Inflammation of the joint (arthritis).  Broken bone (fracture). HOME CARE INSTRUCTIONS   Apply ice to the sore area.  Put ice in a plastic bag.  Place a towel between your skin and the bag.  Leave the ice on for 15-20 minutes, 03-04 times per day for the first 2  days.  Stop using cold packs if they do not help with the pain.  If you have a shoulder sling or immobilizer, wear it as long as your caregiver instructs. Only remove it to shower or bathe. Move your arm as little as possible, but keep your hand moving to prevent swelling.  Squeeze a soft ball or foam pad as much as possible to help prevent swelling.  Only take over-the-counter or prescription medicines for pain, discomfort, or fever as directed by your caregiver. SEEK MEDICAL CARE IF:   Your shoulder pain increases, or new pain develops in your arm, hand, or fingers.  Your hand or fingers become cold and numb.  Your pain is not relieved with medicines. SEEK IMMEDIATE MEDICAL CARE IF:   Your arm, hand, or fingers are numb or tingling.  Your arm, hand, or fingers are significantly swollen or turn white or blue. MAKE SURE YOU:   Understand these instructions.  Will watch your condition.  Will get help right away if you are not doing well or get worse. Document Released: 08/25/2005 Document Revised: 08/09/2012 Document Reviewed: 10/30/2011 [facility] Patient Information 2014 [place], [place].

## 2014-02-26 NOTE — ED Provider Notes (Signed)
CSN: [id number]     Arrival date & time 02/26/14  1907 History   First MD Initiated Contact with Patient 02/26/14 2120     Chief Complaint  Patient presents with  . Shoulder Pain   (Consider location/radiation/quality/duration/timing/severity/associated sxs/prior Treatment) HPI Comments: 38 year old male complains of acute on chronic right shoulder and low back pain. He is in the emergency department at least a couple times for chronic right shoulder pain. 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 The pain in his shoulder is located to the anterior posterior joint, the deltoid and over the a.c. joint. The pain is back is located across the lower most lumbar paraspinal muscles and spine. His face or his numbness to parts of his right arm when leaning forward.   Past Medical History  Diagnosis Date  . Spinal stenosis   . Degenerative disc disease, lumbar   . Herniated disc   . Ruptured lumbar disc    Past Surgical History  Procedure Laterality Date  . Elbow fracture surgery Right    No family history on file. History  Substance Use Topics  . Smoking status: Never Smoker   . Smokeless tobacco: Not on file  . Alcohol Use: Yes     Comment: socially    Review of Systems  Constitutional: Positive for activity change.  Respiratory: Negative.   Gastrointestinal: Negative.   Genitourinary: Negative.   Musculoskeletal: Positive for back pain. Negative for neck pain.       As per HPI  Skin: Negative.   Neurological: Negative for dizziness, weakness, numbness and headaches.    Allergies  Review of patient's allergies indicates no known allergies.  Home Medications   Current Outpatient Rx  Name  Route  Sig  Dispense  Refill  . cyclobenzaprine (FLEXERIL) 5 MG tablet   Oral   Take 1 tablet (5 mg total) by mouth 3 (three) times daily as needed for muscle spasms.   15 tablet   0   .  diclofenac (CATAFLAM) 50 MG tablet   Oral   Take 1 tablet (50 mg total) by mouth 3 (three) times daily.   21 tablet   0   . HYDROcodone-acetaminophen (NORCO/VICODIN) 5-325 MG per tablet   Oral   Take 1-2 tablets by mouth every 4 (four) hours as needed.   10 tablet   0   . HYDROcodone-acetaminophen (NORCO/VICODIN) 5-325 MG per tablet   Oral   Take 1 tablet by mouth every 4 (four) hours as needed.   12 tablet   0   . naproxen sodium (ALEVE) 220 MG tablet   Oral   Take 1 tablet (220 mg total) by mouth 2 (two) times daily with a meal.   30 tablet   0   . oxyCODONE-acetaminophen (PERCOCET/ROXICET) 5-325 MG per tablet   Oral   Take 2 tablets by mouth every 4 (four) hours as needed for severe pain.   15 tablet   0    BP 107/68  Pulse 60  Temp(Src) 97.1 F (36.2 C) (Oral)  SpO2 95% Physical Exam  Nursing note and vitals reviewed. Constitutional: He is oriented to person, place, and time. He appears well-developed and well-nourished.  HENT:  Head: Normocephalic and atraumatic.  Eyes: EOM are normal. Left eye exhibits no discharge.  Neck: Normal range of motion. Neck supple.  Musculoskeletal:  Right shoulder without swelling or deformity. There is tenderness  along the anterior posterior joint line. Abduction is limited to 30. There is tenderness across the lower mid back approximately level L4 and 5. No palpable or observed deformity or swelling.  Neurological: He is alert and oriented to person, place, and time. No cranial nerve deficit.  Skin: Skin is warm and dry.  Psychiatric:  Somewhat histrionic in his description of pain. He is requesting Percocet and possibly a shot of Dilaudid for pain.    ED Course  Procedures (including critical care time) Labs Review Labs Reviewed - No data to display Imaging Review No results found.   MDM   1. Right shoulder pain   2. Strain of lumbar paraspinal muscle   3. Chronic pain    Norco 5 mg #12   Stop exacerbating  chronic back and shoulder pain with heavy lifting. F/U with ortho as above. Declined toradol       Hayden Rasmussen, NP 02/26/14 2146

## 2014-02-26 NOTE — ED Notes (Signed)
Patient states pulled his back out at work Complains of back pain and right shoulder pain

## 2014-02-26 NOTE — ED Provider Notes (Signed)
Medical screening examination/treatment/procedure(s) were performed by non-physician practitioner and as supervising physician I was immediately available for consultation/collaboration.  Leslee Homeavid Azaleah Usman, M.D.  Reuben Likesavid C Jaryan Chicoine, MD 02/26/14 [phone]

## 2014-06-02 ENCOUNTER — Encounter (HOSPITAL_COMMUNITY): Payer: Self-pay | Admitting: Emergency Medicine

## 2014-06-02 ENCOUNTER — Emergency Department (HOSPITAL_COMMUNITY)
Admission: EM | Admit: 2014-06-02 | Discharge: 2014-06-02 | Disposition: A | Discharge status: Home / Self Care | Payer: Self-pay | Attending: Emergency Medicine | Admitting: Emergency Medicine

## 2014-06-02 ENCOUNTER — Emergency Department (INDEPENDENT_AMBULATORY_CARE_PROVIDER_SITE_OTHER)
Admission: EM | Admit: 2014-06-02 | Discharge: 2014-06-02 | Disposition: A | Discharge status: Home / Self Care | Payer: Self-pay | Source: Home / Self Care | Attending: Family Medicine | Admitting: Family Medicine

## 2014-06-02 ENCOUNTER — Emergency Department (INDEPENDENT_AMBULATORY_CARE_PROVIDER_SITE_OTHER): Payer: Self-pay

## 2014-06-02 DIAGNOSIS — M25519 Pain in unspecified shoulder: Secondary | ICD-10-CM | POA: Insufficient documentation

## 2014-06-02 DIAGNOSIS — Z791 Long term (current) use of non-steroidal anti-inflammatories (NSAID): Secondary | ICD-10-CM | POA: Insufficient documentation

## 2014-06-02 DIAGNOSIS — G8929 Other chronic pain: Secondary | ICD-10-CM

## 2014-06-02 DIAGNOSIS — M25511 Pain in right shoulder: Secondary | ICD-10-CM

## 2014-06-02 DIAGNOSIS — R209 Unspecified disturbances of skin sensation: Secondary | ICD-10-CM | POA: Insufficient documentation

## 2014-06-02 DIAGNOSIS — Z79899 Other long term (current) drug therapy: Secondary | ICD-10-CM | POA: Insufficient documentation

## 2014-06-02 MED ORDER — HYDROMORPHONE HCL PF 1 MG/ML IJ SOLN
1.0000 mg | Freq: Once | INTRAMUSCULAR | Status: AC
  Administered 2014-06-02: 1 mg via INTRAMUSCULAR
  Filled 2014-06-02: qty 1

## 2014-06-02 MED ORDER — PREDNISONE 20 MG PO TABS: 60.0000 mg | ORAL_TABLET | Freq: Every day | ORAL | Status: DC

## 2014-06-02 MED ORDER — MEFENAMIC ACID 250 MG PO CAPS: 1.0000 | ORAL_CAPSULE | Freq: Four times a day (QID) | ORAL | Status: DC | PRN

## 2014-06-02 MED ORDER — OXYCODONE-ACETAMINOPHEN 5-325 MG PO TABS: 1.0000 | ORAL_TABLET | Freq: Four times a day (QID) | ORAL | Status: DC | PRN

## 2014-06-02 NOTE — ED Notes (Signed)
Pt. Just left UC and was dissatisfied with the medication given to him.

## 2014-06-02 NOTE — ED Provider Notes (Signed)
CSN: [id number]     Arrival date & time 06/02/14  1137 History   First MD Initiated Contact with Patient 06/02/14 1139     Chief Complaint  Patient presents with  . Shoulder Pain   (Consider location/radiation/quality/duration/timing/severity/associated sxs/prior Treatment) Patient is a 38 y.o. male presenting with shoulder pain. The history is provided by the patient and the spouse.  Shoulder Pain This is a chronic problem. The current episode started more than 1 week ago (chronic pain in shoulder , worse after playing with daughter in pool., also aggravated right elbow., s/p pins in elbow.). The problem has been gradually worsening.    Past Medical History  Diagnosis Date  . Spinal stenosis   . Degenerative disc disease, lumbar   . Herniated disc   . Ruptured lumbar disc    Past Surgical History  Procedure Laterality Date  . Elbow fracture surgery Right    History reviewed. No pertinent family history. History  Substance Use Topics  . Smoking status: Never Smoker   . Smokeless tobacco: Not on file  . Alcohol Use: Yes     Comment: socially    Review of Systems  Constitutional: Negative.   Gastrointestinal: Negative.   Genitourinary: Negative.   Musculoskeletal: Positive for joint swelling. Negative for neck pain.  Skin: Negative.     Allergies  Review of patient's allergies indicates no known allergies.  Home Medications   Prior to Admission medications   Medication Sig Start Date End Date Taking? Authorizing Provider  cyclobenzaprine (FLEXERIL) 5 MG tablet Take 1 tablet (5 mg total) by mouth 3 (three) times daily as needed for muscle spasms. 12/22/13   Candyce ChurnJohn David Wofford III, MD  diclofenac (CATAFLAM) 50 MG tablet Take 1 tablet (50 mg total) by mouth 3 (three) times daily. 02/26/14   Hayden Rasmussenavid Mabe, NP  HYDROcodone-acetaminophen (NORCO/VICODIN) 5-325 MG per tablet Take 1-2 tablets by mouth every 4 (four) hours as needed. 12/20/13   Trevor Maceobyn M Albert, PA-C   HYDROcodone-acetaminophen (NORCO/VICODIN) 5-325 MG per tablet Take 1 tablet by mouth every 4 (four) hours as needed. 02/26/14   Hayden Rasmussenavid Mabe, NP  Mefenamic Acid 250 MG CAPS Take 1 capsule (250 mg total) by mouth 4 (four) times daily as needed. 06/02/14   Linna HoffJames D Babak Lucus, MD  naproxen sodium (ALEVE) 220 MG tablet Take 1 tablet (220 mg total) by mouth 2 (two) times daily with a meal. 12/22/13   Candyce ChurnJohn David Wofford III, MD  oxyCODONE-acetaminophen (PERCOCET/ROXICET) 5-325 MG per tablet Take 2 tablets by mouth every 4 (four) hours as needed for severe pain. 12/22/13   Candyce ChurnJohn David Wofford III, MD   BP 131/93  Pulse 69  Temp(Src) 97.6 F (36.4 C) (Oral)  Resp 16  SpO2 100% Physical Exam  Nursing note and vitals reviewed. Constitutional: He is oriented to person, place, and time. He appears well-developed and well-nourished.  Musculoskeletal: He exhibits tenderness.  Neurological: He is alert and oriented to person, place, and time.  Skin: Skin is warm and dry.    ED Course  Procedures (including critical care time) Labs Review Labs Reviewed - No data to display  Imaging Review Dg Shoulder Right  06/02/2014   CLINICAL DATA:  Shoulder pain  EXAM: RIGHT SHOULDER - 2+ VIEW  COMPARISON:  12/20/2013  FINDINGS: There is no evidence of fracture or dislocation. There is no evidence of arthropathy or other focal bone abnormality. Soft tissues are unremarkable.  IMPRESSION: No acute abnormality noted.   Electronically Signed   By: Loraine LericheMark  Lukens M.D.   On: 06/02/2014 12:11     MDM   1. Chronic right shoulder pain    Pt refused shoulder injection, afraid of needles.    Linna HoffJames D Bette Brienza, MD 06/02/14 [phone]

## 2014-06-02 NOTE — ED Notes (Signed)
Declined W/C at D/C and was escorted to lobby by RN. 

## 2014-06-02 NOTE — ED Notes (Signed)
Pt's wife came back into lobby after pt was discharged. Pt's wife requested a different prescription than what was prescribed. Consulted with Dr. Artis FlockKindl about same. Pt's wife was informed that no other medication would be prescribed. Also, pt did not follow Dr's advice reference a shot that was prescribed that he refused.

## 2014-06-02 NOTE — ED Notes (Signed)
Pt. Stated, I was throwing my daughter in the pool and felt my shoulder pop .  I have so much pain in my shoulder

## 2014-06-02 NOTE — Discharge Instructions (Signed)
Ice, sling and medicine for soreness, see orthopedist for further eval of shoulder pain.

## 2014-06-02 NOTE — ED Provider Notes (Signed)
CSN: [id number]     Arrival date & time 06/02/14  1248 History  This chart was scribed for non-physician practitioner Santiago GladHeather Joeangel Jeanpaul, PA-C, working with Gwyneth SproutWhitney Plunkett, MD, by Yevette EdwardsAngela Bracken, ED Scribe. This patient was seen in room TR05C/TR05C and the patient's care was started at 2:14 PM.   First MD Initiated Contact with Patient 06/02/14 1351     Chief Complaint  Patient presents with  . Shoulder Pain    The history is provided by the patient. No language interpreter was used.   HPI Comments: Robert Spencer is a 38 y.o. male who presents to the Emergency Department complaining of constant right shoulder pain and has been associated with numbness and paresthesia with movement. He reports the shoulder pain has been intermittent for ten years, but the pain increased two weeks ago after throwing his daughter in the pool and hearing his shoulder "pop." He reports the pain is increased with movement and raising his arm.  He has used IBU and Aleve without resolution. The pt states he has an appointment with an orthopedic, Dr. August Saucerean, in two weeks.    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 He attributes his chronic pain to the intensive nature of his job as well as past athletics.   The pt requests treatment by a physician at this ED visit.   Per medical records, the pt was treated today at an urgent care by Dr. Artis FlockKindl who performed an xray of the right shoulder and prescribed an NSAID. The imaging was negative. Dr. Artis FlockKindl offered the pt pain medication IM, but the pt declined.   Past Medical History  Diagnosis Date  . Spinal stenosis   . Degenerative disc disease, lumbar   . Herniated disc   . Ruptured lumbar disc    Past Surgical History  Procedure Laterality Date  . Elbow fracture surgery Right    No family history on file. History  Substance Use Topics  . Smoking status: Never Smoker   . Smokeless tobacco: Not on file  . Alcohol Use: Yes      Comment: socially    Review of Systems  Musculoskeletal: Positive for arthralgias.  Neurological: Positive for numbness.  All other systems reviewed and are negative.   Allergies  Review of patient's allergies indicates no known allergies.  Home Medications   Prior to Admission medications   Medication Sig Start Date End Date Taking? Authorizing Provider  cyclobenzaprine (FLEXERIL) 5 MG tablet Take 1 tablet (5 mg total) by mouth 3 (three) times daily as needed for muscle spasms. 12/22/13   Candyce ChurnJohn David Wofford III, MD  diclofenac (CATAFLAM) 50 MG tablet Take 1 tablet (50 mg total) by mouth 3 (three) times daily. 02/26/14   Hayden Rasmussenavid Mabe, NP  HYDROcodone-acetaminophen (NORCO/VICODIN) 5-325 MG per tablet Take 1-2 tablets by mouth every 4 (four) hours as needed. 12/20/13   Trevor Maceobyn M Albert, PA-C  HYDROcodone-acetaminophen (NORCO/VICODIN) 5-325 MG per tablet Take 1 tablet by mouth every 4 (four) hours as needed. 02/26/14   Hayden Rasmussenavid Mabe, NP  Mefenamic Acid 250 MG CAPS Take 1 capsule (250 mg total) by mouth 4 (four) times daily as needed. 06/02/14   Robert HoffJames D Kindl, MD  naproxen sodium (ALEVE) 220 MG tablet Take 1 tablet (220 mg total) by mouth 2 (two) times daily with a meal. 12/22/13   Candyce ChurnJohn David Wofford III, MD  oxyCODONE-acetaminophen (PERCOCET/ROXICET) 5-325 MG per tablet Take 2 tablets by mouth every 4 (four) hours as needed for  severe pain. 12/22/13   Candyce ChurnJohn David Wofford III, MD   Triage Vitals: BP 118/88  Pulse 66  Temp(Src) 98.5 F (36.9 C) (Oral)  Resp 20  SpO2 98%  Physical Exam  Nursing note and vitals reviewed. Constitutional: He is oriented to person, place, and time. He appears well-developed and well-nourished. No distress.  HENT:  Head: Normocephalic and atraumatic.  Eyes: Conjunctivae and EOM are normal.  Neck: Neck supple. No tracheal deviation present.  Cardiovascular: Normal rate, regular rhythm and normal heart sounds.   Pulses:      Radial pulses are 2+ on the right side, and 2+  on the left side.  Pulmonary/Chest: Effort normal. No respiratory distress.  Musculoskeletal:       Right shoulder: He exhibits decreased range of motion and tenderness. He exhibits no swelling, no effusion, no deformity and normal pulse.  Limited abduction and flexion of the right arm. Pain with flexion.   Neurological: He is alert and oriented to person, place, and time.  Distal sensation of right hand intact  Skin: Skin is warm and dry.  Psychiatric: He has a normal mood and affect. His behavior is normal.    ED Course  Procedures (including critical care time)  DIAGNOSTIC STUDIES: Oxygen Saturation is 98% on room air, normal by my interpretation.    COORDINATION OF CARE:  2:24 PM- Discussed treatment plan with patient, and the patient agreed to the plan.   2:24 PM- Consulted Dr. Anitra LauthPlunkett re the pt's care.   3:02 PM- Dr. Anitra LauthPlunkett assessed the pt. She recommends the pt receive Dilaudid IM.   Labs Review Labs Reviewed - No data to display  Imaging Review Dg Shoulder Right  06/02/2014   CLINICAL DATA:  Shoulder pain  EXAM: RIGHT SHOULDER - 2+ VIEW  COMPARISON:  12/20/2013  FINDINGS: There is no evidence of fracture or dislocation. There is no evidence of arthropathy or other focal bone abnormality. Soft tissues are unremarkable.  IMPRESSION: No acute abnormality noted.   Electronically Signed   By: Alcide CleverMark  Lukens M.D.   On: 06/02/2014 12:11     EKG Interpretation None      MDM   Final diagnoses:  None   Patient presenting with right shoulder pain.  He had a xray earlier today, which was negative. Patient with limited ROM of the shoulder.  He is neurovascularly intact.  Has an appointment with Dr. August Saucerean with Orthopedics in 2 weeks.  Feel that the patient is stable for discharge.  Patient discharged home with pain medication.  Return precautions given.   Santiago GladHeather Lenis Nettleton, PA-C 06/03/14 [id number]

## 2014-06-02 NOTE — ED Notes (Signed)
Plan of  Care  Discussed  With  Pt  Pt  Given  rx  For  Pain meds

## 2014-06-02 NOTE — ED Notes (Signed)
MD at bedside.EDP at bed side.

## 2014-06-02 NOTE — ED Notes (Signed)
Pt  Has  Pain  r  Shoulder  And  r  Elbow   -  He  States  He  Noticed       A   Pop  In his  Shoulder 1  Week  Ago  When [facility]  [facility]  [facility]        Pt has  Had  Problems  With  Shoulder in  Past   As  Well  As  Pins  In his  Elbow  In past

## 2014-06-02 NOTE — ED Notes (Signed)
Pt's wife came back into lobby after discharge asking for a different medication instead of what was prescribed. Consulted with Dr. Artis FlockKindl about same. Pt's wife was informed that a different medication would not be prescribed.
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
## 2014-10-14 ENCOUNTER — Encounter (HOSPITAL_COMMUNITY): Payer: Self-pay | Admitting: Emergency Medicine

## 2014-10-14 ENCOUNTER — Emergency Department (HOSPITAL_COMMUNITY)
Admission: EM | Admit: 2014-10-14 | Discharge: 2014-10-15 | Disposition: A | Discharge status: Home / Self Care | Payer: Self-pay | Attending: Emergency Medicine | Admitting: Emergency Medicine

## 2014-10-14 DIAGNOSIS — M546 Pain in thoracic spine: Secondary | ICD-10-CM | POA: Insufficient documentation

## 2014-10-14 DIAGNOSIS — Z8781 Personal history of (healed) traumatic fracture: Secondary | ICD-10-CM | POA: Insufficient documentation

## 2014-10-14 DIAGNOSIS — Z791 Long term (current) use of non-steroidal anti-inflammatories (NSAID): Secondary | ICD-10-CM | POA: Insufficient documentation

## 2014-10-14 DIAGNOSIS — Z7952 Long term (current) use of systemic steroids: Secondary | ICD-10-CM | POA: Insufficient documentation

## 2014-10-14 DIAGNOSIS — R109 Unspecified abdominal pain: Secondary | ICD-10-CM | POA: Insufficient documentation

## 2014-10-14 MED ORDER — OXYCODONE-ACETAMINOPHEN 5-325 MG PO TABS
2.0000 | ORAL_TABLET | Freq: Once | ORAL | Status: AC
  Administered 2014-10-14: 2 via ORAL
  Filled 2014-10-14: qty 2

## 2014-10-14 MED ORDER — CYCLOBENZAPRINE HCL 10 MG PO TABS
10.0000 mg | ORAL_TABLET | Freq: Once | ORAL | Status: AC
  Administered 2014-10-14: 10 mg via ORAL
  Filled 2014-10-14: qty 1

## 2014-10-14 NOTE — ED Notes (Signed)
Patient pacing around room.  He states, "I can't sit still.  I need a shot of pain medicine.  I can't get comfortable."

## 2014-10-14 NOTE — ED Provider Notes (Signed)
CSN: [id number]     Arrival date & time 10/14/14  2115 History   None    Chief Complaint  Patient presents with  . Back Pain   Patient is a 38 y.o. male presenting with back pain. The history is provided by the patient. No language interpreter was used.  Back Pain This chart was scribed for non-physician practitioner, Elpidio AnisShari Kycen Spalla, PA-C working with Mirian MoMatthew Gentry, MD, by Andrew Auaven Small, ED Scribe. This patient was seen in room WTR7/WTR7 and the patient's care was started at 10:54 PM.  Robert Spencer is a 38 y.o. male who presents to the Emergency Department complaining of intermittent, thoracic pain that radiates to left flank onset 3 days ago. Pt reports trouble breathing due to back pain. Pt has tried ibuprofen and icy-hot without relief. Pt reports hx lower back pain but reports this is a new pain. Pt denies nausea and emesis. Pt denies drug allergies.      Past Medical History  Diagnosis Date  . Spinal stenosis   . Degenerative disc disease, lumbar   . Herniated disc   . Ruptured lumbar disc    Past Surgical History  Procedure Laterality Date  . Elbow fracture surgery Right    Family History  Problem Relation Age of Onset  . Cancer Other    History  Substance Use Topics  . Smoking status: Never Smoker   . Smokeless tobacco: Not on file  . Alcohol Use: Yes     Comment: socially    Review of Systems  Genitourinary: Positive for flank pain.  Musculoskeletal: Positive for back pain.   Allergies  Review of patient's allergies indicates no known allergies.  Home Medications   Prior to Admission medications   Medication Sig Start Date End Date Taking? Authorizing Provider  ibuprofen (ADVIL,MOTRIN) 200 MG tablet Take 800 mg by mouth every 6 (six) hours as needed for headache or mild pain (for pain).    Yes Historical Provider, MD  Mefenamic Acid 250 MG CAPS Take 1 capsule (250 mg total) by mouth 4 (four) times daily as needed. 06/02/14   Linna HoffJames D Kindl, MD  naproxen sodium  (ALEVE) 220 MG tablet Take 1 tablet (220 mg total) by mouth 2 (two) times daily with a meal. 12/22/13   Warnell Foresterrey Wofford, MD  oxyCODONE-acetaminophen (PERCOCET/ROXICET) 5-325 MG per tablet Take 1-2 tablets by mouth every 6 (six) hours as needed for severe pain. 06/02/14   Heather Laisure, PA-C  predniSONE (DELTASONE) 20 MG tablet Take 3 tablets (60 mg total) by mouth daily. 06/02/14   Heather Laisure, PA-C   BP 112/62 mmHg  Pulse 86  Temp(Src) 98.3 F (36.8 C) (Oral)  Resp 20  Ht 6\' 2"  (1.88 m)  Wt 220 lb (99.791 kg)  BMI 28.23 kg/m2  SpO2 98% Physical Exam  Constitutional: He is oriented to person, place, and time. He appears well-developed and well-nourished. No distress.  Uncomfortable appearing.   HENT:  Head: Normocephalic and atraumatic.  Eyes: Conjunctivae and EOM are normal.  Neck: Neck supple.  Cardiovascular: Normal rate.   Pulmonary/Chest: Effort normal.  Abdominal: There is no tenderness.  Musculoskeletal: Normal range of motion.  Minimal left sided para thoracic tenderness no swelling. Tender extends to flank.  Neurological: He is alert and oriented to person, place, and time.  Skin: Skin is warm and dry.  Psychiatric: He has a normal mood and affect. His behavior is normal.  Nursing note and vitals reviewed.  ED Course  Procedures  DIAGNOSTIC STUDIES: Oxygen  Saturation is 98% on RA, normal by my interpretation.    COORDINATION OF CARE: 10:56 PM- Pt advised of plan for treatment and pt agrees.  Labs Review Labs Reviewed - No data to display  Imaging Review No results found.   EKG Interpretation None     Results for orders placed or performed during the hospital encounter of 10/14/14  Urinalysis, Routine w reflex microscopic  Result Value Ref Range   Color, Urine YELLOW YELLOW   APPearance CLEAR CLEAR   Specific Gravity, Urine 1.014 1.005 - 1.030   pH 6.0 5.0 - 8.0   Glucose, UA NEGATIVE NEGATIVE mg/dL   Hgb urine dipstick NEGATIVE NEGATIVE   Bilirubin  Urine NEGATIVE NEGATIVE   Ketones, ur NEGATIVE NEGATIVE mg/dL   Protein, ur NEGATIVE NEGATIVE mg/dL   Urobilinogen, UA 0.2 0.0 - 1.0 mg/dL   Nitrite NEGATIVE NEGATIVE   Leukocytes, UA TRACE (A) NEGATIVE  Urine microscopic-add on  Result Value Ref Range   Squamous Epithelial / LPF RARE RARE   WBC, UA 3-6 <3 WBC/hpf   RBC / HPF 0-2 <3 RBC/hpf   Bacteria, UA RARE RARE   Urine-Other MUCOUS PRESENT   CBC with Differential  Result Value Ref Range   WBC 5.8 4.0 - 10.5 K/uL   RBC 4.49 4.22 - 5.81 MIL/uL   Hemoglobin 14.3 13.0 - 17.0 g/dL   HCT 40.9 81.1 - 91.4 %   MCV 92.0 78.0 - 100.0 fL   MCH 31.8 26.0 - 34.0 pg   MCHC 34.6 30.0 - 36.0 g/dL   RDW 78.2 95.6 - 21.3 %   Platelets 196 150 - 400 K/uL   Neutrophils Relative % 43 43 - 77 %   Neutro Abs 2.5 1.7 - 7.7 K/uL   Lymphocytes Relative 43 12 - 46 %   Lymphs Abs 2.5 0.7 - 4.0 K/uL   Monocytes Relative 10 3 - 12 %   Monocytes Absolute 0.6 0.1 - 1.0 K/uL   Eosinophils Relative 3 0 - 5 %   Eosinophils Absolute 0.2 0.0 - 0.7 K/uL   Basophils Relative 1 0 - 1 %   Basophils Absolute 0.0 0.0 - 0.1 K/uL  Basic metabolic panel  Result Value Ref Range   Sodium 139 137 - 147 mEq/L   Potassium 3.6 (L) 3.7 - 5.3 mEq/L   Chloride 98 96 - 112 mEq/L   CO2 27 19 - 32 mEq/L   Glucose, Bld 93 70 - 99 mg/dL   BUN 13 6 - 23 mg/dL   Creatinine, Ser 0.86 0.50 - 1.35 mg/dL   Calcium 9.9 8.4 - 57.8 mg/dL   GFR calc non Af Amer >90 >90 mL/min   GFR calc Af Amer >90 >90 mL/min   Anion gap 14 5 - 15   Ct Renal Stone Study  10/15/2014   CLINICAL DATA:  Left flank pain for 3 days  EXAM: CT ABDOMEN AND PELVIS WITHOUT CONTRAST  TECHNIQUE: Multidetector CT imaging of the abdomen and pelvis was performed following the standard protocol without IV contrast.  COMPARISON:  None.  FINDINGS: Lung bases are free of acute infiltrate or sizable effusion.  The liver, gallbladder, spleen, adrenal glands and pancreas are normal in their CT appearance. Kidneys are  well visualized bilaterally. No renal calculi or obstructive changes are seen. The ureters are well visualized to the level of the urinary bladder. The bladder is incompletely distended. No pelvic mass lesion is seen.  The appendix is within normal limits. No significant lymphadenopathy  is noted. No pelvic mass lesion is seen. No bony abnormality is seen.  IMPRESSION: No renal calculi or urinary tract obstructive changes are noted.  No acute abnormality is noted.   Electronically Signed   By: Alcide CleverMark  Lukens M.D.   On: 10/15/2014 02:35    MDM   Final diagnoses:  None   1. Back pain  The patient's pain appears out of proportion to muscular spasm or strain pain. He is pacing, restless, unable to sit still, unrelieved with multiple doses of pain medications, including muscle relaxers and Toradol. CT scan ordered - negative for stones or other visualized abnormality. VSS, afebrile. No leukocytosis or significant lab abnormality. Will discharge home with rest for the next 2 days, supportive care, pain relief. Return precautions discussed.   I personally performed the services described in this documentation, which was scribed in my presence. The recorded information has been reviewed and is accurate.      Arnoldo HookerShari A Nicandro Perrault, PA-C 10/15/14 [id number]  Loren Raceravid Yelverton, MD 10/15/14 [phone]

## 2014-10-14 NOTE — ED Notes (Signed)
Pt is c/o middle back pain to the left of his spine  Pt states it is making it hard to breathe  Pt states he has chronic back pain but this pain is different than that he has had in the past  Pt states the pain is radiating to the front

## 2014-10-15 ENCOUNTER — Emergency Department (HOSPITAL_COMMUNITY): Payer: Self-pay

## 2014-10-15 LAB — URINE MICROSCOPIC-ADD ON

## 2014-10-15 LAB — BASIC METABOLIC PANEL
Anion gap: 14 (ref 5–15)
BUN: 13 mg/dL (ref 6–23)
CALCIUM: 9.9 mg/dL (ref 8.4–10.5)
CO2: 27 meq/L (ref 19–32)
CREATININE: 1.02 mg/dL (ref 0.50–1.35)
Chloride: 98 mEq/L (ref 96–112)
GFR calc Af Amer: >90 mL/min (ref >90)
GFR calc non Af Amer: >90 mL/min (ref >90)
GLUCOSE: 93 mg/dL (ref 70–99)
Potassium: 3.6 mEq/L — ABNORMAL LOW (ref 3.7–5.3)
Sodium: 139 mEq/L (ref 137–147)

## 2014-10-15 LAB — URINALYSIS, ROUTINE W REFLEX MICROSCOPIC
Bilirubin Urine: NEGATIVE
Glucose, UA: NEGATIVE mg/dL
Hgb urine dipstick: NEGATIVE
Ketones, ur: NEGATIVE mg/dL
NITRITE: NEGATIVE
Protein, ur: NEGATIVE mg/dL
SPECIFIC GRAVITY, URINE: 1.014 (ref 1.005–1.030)
UROBILINOGEN UA: 0.2 mg/dL (ref 0.0–1.0)
pH: 6 (ref 5.0–8.0)

## 2014-10-15 LAB — CBC WITH DIFFERENTIAL/PLATELET
Basophils Absolute: 0 10*3/uL (ref 0.0–0.1)
Basophils Relative: 1 % (ref 0–1)
Eosinophils Absolute: 0.2 10*3/uL (ref 0.0–0.7)
Eosinophils Relative: 3 % (ref 0–5)
HEMATOCRIT: 41.3 % (ref 39.0–52.0)
Hemoglobin: 14.3 g/dL (ref 13.0–17.0)
LYMPHS ABS: 2.5 10*3/uL (ref 0.7–4.0)
LYMPHS PCT: 43 % (ref 12–46)
MCH: 31.8 pg (ref 26.0–34.0)
MCHC: 34.6 g/dL (ref 30.0–36.0)
MCV: 92 fL (ref 78.0–100.0)
MONO ABS: 0.6 10*3/uL (ref 0.1–1.0)
MONOS PCT: 10 % (ref 3–12)
Neutro Abs: 2.5 10*3/uL (ref 1.7–7.7)
Neutrophils Relative %: 43 % (ref 43–77)
Platelets: 196 10*3/uL (ref 150–400)
RBC: 4.49 MIL/uL (ref 4.22–5.81)
RDW: 12.1 % (ref 11.5–15.5)
WBC: 5.8 10*3/uL (ref 4.0–10.5)

## 2014-10-15 MED ORDER — HYDROMORPHONE HCL 1 MG/ML IJ SOLN
1.0000 mg | Freq: Once | INTRAMUSCULAR | Status: AC
  Administered 2014-10-15: 1 mg via INTRAVENOUS
  Filled 2014-10-15: qty 1

## 2014-10-15 MED ORDER — CYCLOBENZAPRINE HCL 10 MG PO TABS: 10.0000 mg | ORAL_TABLET | Freq: Two times a day (BID) | ORAL | Status: DC | PRN

## 2014-10-15 MED ORDER — SODIUM CHLORIDE 0.9 % IV BOLUS (SEPSIS)
500.0000 mL | Freq: Once | INTRAVENOUS | Status: AC
  Administered 2014-10-15: 500 mL via INTRAVENOUS

## 2014-10-15 MED ORDER — DIAZEPAM 5 MG/ML IJ SOLN
5.0000 mg | Freq: Once | INTRAMUSCULAR | Status: AC
  Administered 2014-10-15: 5 mg via INTRAVENOUS
  Filled 2014-10-15: qty 2

## 2014-10-15 MED ORDER — KETOROLAC TROMETHAMINE 60 MG/2ML IM SOLN
60.0000 mg | Freq: Once | INTRAMUSCULAR | Status: AC
  Administered 2014-10-15: 60 mg via INTRAMUSCULAR
  Filled 2014-10-15: qty 2

## 2014-10-15 MED ORDER — OXYCODONE-ACETAMINOPHEN 5-325 MG PO TABS: 1.0000 | ORAL_TABLET | ORAL | Status: DC | PRN

## 2014-10-15 MED ORDER — IBUPROFEN 800 MG PO TABS: 800.0000 mg | ORAL_TABLET | Freq: Three times a day (TID) | ORAL | Status: DC

## 2014-10-15 NOTE — ED Notes (Signed)
Patient continues to pace and reports no relief following pain medication.

## 2014-10-15 NOTE — Discharge Instructions (Signed)
Back Pain, Adult Low back pain is very common. About 1 in 5 people have back pain.The cause of low back pain is rarely dangerous. The pain often gets better over time.About half of people with a sudden onset of back pain feel better in just 2 weeks. About 8 in 10 people feel better by 6 weeks.  CAUSES Some common causes of back pain include:  Strain of the muscles or ligaments supporting the spine.  Wear and tear (degeneration) of the spinal discs.  Arthritis.  Direct injury to the back. DIAGNOSIS Most of the time, the direct cause of low back pain is not known.However, back pain can be treated effectively even when the exact cause of the pain is unknown.Answering your caregiver's questions about your overall health and symptoms is one of the most accurate ways to make sure the cause of your pain is not dangerous. If your caregiver needs more information, he or she may order lab work or imaging tests (X-rays or MRIs).However, even if imaging tests show changes in your back, this usually does not require surgery. HOME CARE INSTRUCTIONS For many people, back pain returns.Since low back pain is rarely dangerous, it is often a condition that people can learn to [facility] their own.   Remain active. It is stressful on the back to sit or stand in one place. Do not sit, drive, or stand in one place for more than 30 minutes at a time. Take short walks on level surfaces as soon as pain allows.Try to increase the length of time you walk each day.  Do not stay in bed.Resting more than 1 or 2 days can delay your recovery.  Do not avoid exercise or work.Your body is made to move.It is not dangerous to be active, even though your back may hurt.Your back will likely heal faster if you return to being active before your pain is gone.  Pay attention to your body when you bend and lift. Many people have less discomfortwhen lifting if they bend their knees, keep the load close to their bodies,and  avoid twisting. Often, the most comfortable positions are those that put less stress on your recovering back.  Find a comfortable position to sleep. Use a firm mattress and lie on your side with your knees slightly bent. If you lie on your back, put a pillow under your knees.  Only take over-the-counter or prescription medicines as directed by your caregiver. Over-the-counter medicines to reduce pain and inflammation are often the most helpful.Your caregiver may prescribe muscle relaxant drugs.These medicines help dull your pain so you can more quickly return to your normal activities and healthy exercise.  Put ice on the injured area.  Put ice in a plastic bag.  Place a towel between your skin and the bag.  Leave the ice on for 15-20 minutes, 03-04 times a day for the first 2 to 3 days. After that, ice and heat may be alternated to reduce pain and spasms.  Ask your caregiver about trying back exercises and gentle massage. This may be of some benefit.  Avoid feeling anxious or stressed.Stress increases muscle tension and can worsen back pain.It is important to recognize when you are anxious or stressed and learn ways to manage it.Exercise is a great option. SEEK MEDICAL CARE IF:  You have pain that is not relieved with rest or medicine.  You have pain that does not improve in 1 week.  You have new symptoms.  You are generally not feeling well. SEEK  IMMEDIATE MEDICAL CARE IF:   You have pain that radiates from your back into your legs.  You develop new bowel or bladder control problems.  You have unusual weakness or numbness in your arms or legs.  You develop nausea or vomiting.  You develop abdominal pain.  You feel faint. Document Released: 11/15/2005 Document Revised: 05/16/2012 Document Reviewed: 03/19/2014 [facility] Patient Information 2015 [place], [place]. This information is not intended to replace advice given to you by your health care provider. Make sure you  discuss any questions you have with your health care provider. Heat Therapy Heat therapy can help ease sore, stiff, injured, and tight muscles and joints. Heat relaxes your muscles, which may help ease your pain.  RISKS AND COMPLICATIONS If you have any of the following conditions, do not use heat therapy unless your health care provider has approved:  Poor circulation.  Healing wounds or scarred skin in the area being treated.  Diabetes, heart disease, or high blood pressure.  Not being able to feel (numbness) the area being treated.  Unusual swelling of the area being treated.  Active infections.  Blood clots.  Cancer.  Inability to communicate pain. This may include young children and people who have problems with their brain function (dementia).  Pregnancy. Heat therapy should only be used on old, pre-existing, or long-lasting (chronic) injuries. Do not use heat therapy on new injuries unless directed by your health care provider. HOW TO USE HEAT THERAPY There are several different kinds of heat therapy, including:  Moist heat pack.  Warm water bath.  Hot water bottle.  Electric heating pad.  Heated gel pack.  Heated wrap.  Electric heating pad. Use the heat therapy method suggested by your health care provider. Follow your health care provider's instructions on when and how to use heat therapy. GENERAL HEAT THERAPY RECOMMENDATIONS  Do not sleep while using heat therapy. Only use heat therapy while you are awake.  Your skin may turn pink while using heat therapy. Do not use heat therapy if your skin turns red.  Do not use heat therapy if you have new pain.  High heat or long exposure to heat can cause burns. Be careful when using heat therapy to avoid burning your skin.  Do not use heat therapy on areas of your skin that are already irritated, such as with a rash or sunburn. SEEK MEDICAL CARE IF:  You have blisters, redness, swelling, or numbness.  You  have new pain.  Your pain is worse. MAKE SURE YOU:  Understand these instructions.  Will watch your condition.  Will get help right away if you are not doing well or get worse. Document Released: 02/07/2012 Document Revised: 04/01/2014 Document Reviewed: 01/08/2014 [facility]Exit[facility] Patient Information 2015 [place]Care, [place]. This information is not intended to replace advice given to you by your health care provider. Make sure you discuss any questions you have with your health care provider. Muscle Cramps and Spasms Muscle cramps and spasms occur when a muscle or muscles tighten and you have no control over this tightening (involuntary muscle contraction). They are a common problem and can develop in any muscle. The most common place is in the calf muscles of the leg. Both muscle cramps and muscle spasms are involuntary muscle contractions, but they also have differences:   Muscle cramps are sporadic and painful. They may last a few seconds to a quarter of an hour. Muscle cramps are often more forceful and last longer than muscle spasms.  Muscle spasms  may or may not be painful. They may also last just a few seconds or much longer. CAUSES  It is uncommon for cramps or spasms to be due to a serious underlying problem. In many cases, the cause of cramps or spasms is unknown. Some common causes are:   Overexertion.   Overuse from repetitive motions (doing the same thing over and over).   Remaining in a certain position for a long period of time.   Improper preparation, form, or technique while performing a sport or activity.   Dehydration.   Injury.   Side effects of some medicines.   Abnormally low levels of the salts and ions in your blood (electrolytes), especially potassium and calcium. This could happen if you are taking water pills (diuretics) or you are pregnant.  Some underlying medical problems can make it more likely to develop cramps or spasms. These include, but are not  limited to:   Diabetes.   Parkinson disease.   Hormone disorders, such as thyroid problems.   Alcohol abuse.   Diseases specific to muscles, joints, and bones.   Blood vessel disease where not enough blood is getting to the muscles.  HOME CARE INSTRUCTIONS   Stay well hydrated. Drink enough water and fluids to keep your urine clear or pale yellow.  It may be helpful to massage, stretch, and relax the affected muscle.  For tight or tense muscles, use a warm towel, heating pad, or hot shower water directed to the affected area.  If you are sore or have pain after a cramp or spasm, applying ice to the affected area may relieve discomfort.  Put ice in a plastic bag.  Place a towel between your skin and the bag.  Leave the ice on for 15-20 minutes, 03-04 times a day.  Medicines used to treat a known cause of cramps or spasms may help reduce their frequency or severity. Only take over-the-counter or prescription medicines as directed by your caregiver. SEEK MEDICAL CARE IF:  Your cramps or spasms get more severe, more frequent, or do not improve over time.  MAKE SURE YOU:   Understand these instructions.  Will watch your condition.  Will get help right away if you are not doing well or get worse. Document Released: 05/07/2002 Document Revised: 03/12/2013 Document Reviewed: 11/01/2012 [facility]ExitCare Patient Information 2015 [place]ExitCare, [place]LL[place]. This information is not intended to replace advice given to you by your health care provider. Make sure you discuss any questions you have with your health care provider.

## 2015-02-23 ENCOUNTER — Emergency Department (HOSPITAL_COMMUNITY)
Admission: EM | Admit: 2015-02-23 | Discharge: 2015-02-23 | Disposition: A | Discharge status: Home / Self Care | Payer: No Typology Code available for payment source | Attending: Emergency Medicine | Admitting: Emergency Medicine

## 2015-02-23 ENCOUNTER — Encounter (HOSPITAL_COMMUNITY): Payer: Self-pay | Admitting: Emergency Medicine

## 2015-02-23 DIAGNOSIS — G8929 Other chronic pain: Secondary | ICD-10-CM | POA: Insufficient documentation

## 2015-02-23 DIAGNOSIS — Z791 Long term (current) use of non-steroidal anti-inflammatories (NSAID): Secondary | ICD-10-CM | POA: Insufficient documentation

## 2015-02-23 DIAGNOSIS — M25511 Pain in right shoulder: Secondary | ICD-10-CM | POA: Diagnosis not present

## 2015-02-23 DIAGNOSIS — Z7952 Long term (current) use of systemic steroids: Secondary | ICD-10-CM | POA: Diagnosis not present

## 2015-02-23 HISTORY — DX: Unspecified rotator cuff tear or rupture of unspecified shoulder, not specified as traumatic: M75.100

## 2015-02-23 NOTE — ED Notes (Signed)
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  Was dx with this at the beginning of the month.  Pt has surgery scheduled for next month but states that he cannot take the pain.

## 2015-02-23 NOTE — Discharge Instructions (Signed)
You may apply ice and heat to her shoulder. Continue to take tramadol that you have at home for pain. I do not suggest taking three 800 mg ibuprofen at a time, take 1 every 8 hours as needed for pain. Rotator Cuff Injury Rotator cuff injury is any type of injury to the set of muscles and tendons that make up the stabilizing unit of your shoulder. This unit holds the ball of your upper arm bone (humerus) in the socket of your shoulder blade (scapula).  CAUSES Injuries to your rotator cuff most commonly come from sports or activities that cause your arm to be moved repeatedly over your head. Examples of this include throwing, weight lifting, swimming, or racquet sports. Long lasting (chronic) irritation of your rotator cuff can cause soreness and swelling (inflammation), bursitis, and eventual damage to your tendons, such as a tear (rupture). SIGNS AND SYMPTOMS Acute rotator cuff tear:  Sudden tearing sensation followed by severe pain shooting from your upper shoulder down your arm toward your elbow.  Decreased range of motion of your shoulder because of pain and muscle spasm.  Severe pain.  Inability to raise your arm out to the side because of pain and loss of muscle power (large tears). Chronic rotator cuff tear:  Pain that usually is worse at night and may interfere with sleep.  Gradual weakness and decreased shoulder motion as the pain worsens.  Decreased range of motion. Rotator cuff tendinitis:  Deep ache in your shoulder and the outside upper arm over your shoulder.  Pain that comes on gradually and becomes worse when lifting your arm to the side or turning it inward. DIAGNOSIS Rotator cuff injury is diagnosed through a medical history, physical exam, and imaging exam. The medical history helps determine the type of rotator cuff injury. Your health care provider will look at your injured shoulder, feel the injured area, and ask you to move your shoulder in different positions.  X-ray exams typically are done to rule out other causes of shoulder pain, such as fractures. MRI is the exam of choice for the most severe shoulder injuries because the images show muscles and tendons.  TREATMENT  Chronic tear:  Medicine for pain, such as acetaminophen or ibuprofen.  Physical therapy and range-of-motion exercises may be helpful in maintaining shoulder function and strength.  Steroid injections into your shoulder joint.  Surgical repair of the rotator cuff if the injury does not heal with noninvasive treatment. Acute tear:  Anti-inflammatory medicines such as ibuprofen and naproxen to help reduce pain and swelling.  A sling to help support your arm and rest your rotator cuff muscles. Long-term use of a sling is not advised. It may cause significant stiffening of the shoulder joint.  Surgery may be considered within a few weeks, especially in younger, active people, to return the shoulder to full function.  Indications for surgical treatment include the following:  Age younger than 60 years.  Rotator cuff tears that are complete.  Physical therapy, rest, and anti-inflammatory medicines have been used for 6-8 weeks, with no improvement.  Employment or sporting activity that requires constant shoulder use. Tendinitis:  Anti-inflammatory medicines such as ibuprofen and naproxen to help reduce pain and swelling.  A sling to help support your arm and rest your rotator cuff muscles. Long-term use of a sling is not advised. It may cause significant stiffening of the shoulder joint.  Severe tendinitis may require:  Steroid injections into your shoulder joint.  Physical therapy.  Surgery. HOME CARE  INSTRUCTIONS   Apply ice to your injury:  Put ice in a plastic bag.  Place a towel between your skin and the bag.  Leave the ice on for 20 minutes, 2-3 times a day.  If you have a shoulder immobilizer (sling and straps), wear it until told otherwise by your health  care provider.  You may want to sleep on several pillows or in a recliner at night to lessen swelling and pain.  Only take over-the-counter or prescription medicines for pain, discomfort, or fever as directed by your health care provider.  Do simple hand squeezing exercises with a soft rubber ball to decrease hand swelling. SEEK MEDICAL CARE IF:   Your shoulder pain increases, or new pain or numbness develops in your arm, hand, or fingers.  Your hand or fingers are colder than your other hand. SEEK IMMEDIATE MEDICAL CARE IF:   Your arm, hand, or fingers are numb or tingling.  Your arm, hand, or fingers are increasingly swollen and painful, or they turn white or blue. MAKE SURE YOU:  Understand these instructions.  Will watch your condition.  Will get help right away if you are not doing well or get worse. Document Released: 11/12/2000 Document Revised: 11/20/2013 Document Reviewed: 06/27/2013 [facility]Exit[facility] Patient Information 2015 [place], [place]LL[place]. This information is not intended to replace advice given to you by your health care provider. Make sure you discuss any questions you have with your health care provider.  Chronic Pain Chronic pain can be defined as pain that is off and on and lasts for 3-6 months or longer. Many things cause chronic pain, which can make it difficult to make a diagnosis. There are many treatment options available for chronic pain. However, finding a treatment that works well for you may require trying various approaches until the right one is found. Many people benefit from a combination of two or more types of treatment to control their pain. SYMPTOMS  Chronic pain can occur anywhere in the body and can range from mild to very severe. Some types of chronic pain include:  Headache.  Low back pain.  Cancer pain.  Arthritis pain.  Neurogenic pain. This is pain resulting from damage to nerves. People with chronic pain may also have other symptoms such  as:  Depression.  Anger.  Insomnia.  Anxiety. DIAGNOSIS  Your health care provider will help diagnose your condition over time. In many cases, the initial focus will be on excluding possible conditions that could be causing the pain. Depending on your symptoms, your health care provider may order tests to diagnose your condition. Some of these tests may include:   Blood tests.   CT scan.   MRI.   X-rays.   Ultrasounds.   Nerve conduction studies.  You may need to see a specialist.  TREATMENT  Finding treatment that works well may take time. You may be referred to a pain specialist. He or she may prescribe medicine or therapies, such as:   Mindful meditation or yoga.  Shots (injections) of numbing or pain-relieving medicines into the spine or area of pain.  Local electrical stimulation.  Acupuncture.   Massage therapy.   Aroma, color, light, or sound therapy.   Biofeedback.   Working with a physical therapist to keep from getting stiff.   Regular, gentle exercise.   Cognitive or behavioral therapy.   Group support.  Sometimes, surgery may be recommended.  HOME CARE INSTRUCTIONS   Take all medicines as directed by your health care provider.  Lessen stress in your life by relaxing and doing things such as listening to calming music.   Exercise or be active as directed by your health care provider.   Eat a healthy diet and include things such as vegetables, fruits, fish, and lean meats in your diet.   Keep all follow-up appointments with your health care provider.   Attend a support group with others suffering from chronic pain. SEEK MEDICAL CARE IF:   Your pain gets worse.   You develop a new pain that was not there before.   You cannot tolerate medicines given to you by your health care provider.   You have new symptoms since your last visit with your health care provider.  SEEK IMMEDIATE MEDICAL CARE IF:   You feel weak.    You have decreased sensation or numbness.   You lose control of bowel or bladder function.   Your pain suddenly gets much worse.   You develop shaking.  You develop chills.  You develop confusion.  You develop chest pain.  You develop shortness of breath.  MAKE SURE YOU:  Understand these instructions.  Will watch your condition.  Will get help right away if you are not doing well or get worse. Document Released: 08/07/2002 Document Revised: 07/18/2013 Document Reviewed: 05/11/2013 [facility] Patient Information 2015 [place], [place]. This information is not intended to replace advice given to you by your health care provider. Make sure you discuss any questions you have with your health care provider.

## 2015-02-23 NOTE — ED Provider Notes (Signed)
CSN: [id number]     Arrival date & time 02/23/15  1409 History  This chart was scribed for a non-physician practitioner, Kathrynn Speed, PA-C working with Nelva Nay, MD by [place] Peace, ED Scribe. The patient was seen in WTR7/WTR[facility]. The patient's care was started at 2:35 PM.     Chief Complaint  Patient presents with  . Shoulder Pain      Patient is a 39 y.o. male presenting with shoulder pain. The history is provided by the patient. No language interpreter was used.  Shoulder Pain HPI Comments: Robert Spencer is a 39 y.o. male who presents to the Emergency Department complaining of left shoulder pain. He describes pain as "burning" sensation. He reports pain has gotten worse in the past 2 days. 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 Pt has tried taking Ibuprofen for pain without relief.  Pt states he is scheduled to have shoulder surgery next month by Dr. Luiz Blare to repair torn rotator cuff and torn bicep muscle.   Past Medical History  Diagnosis Date  . Spinal stenosis   . Degenerative disc disease, lumbar   . Herniated disc   . Ruptured lumbar disc   . Torn rotator cuff    Past Surgical History  Procedure Laterality Date  . Elbow fracture surgery Right    Family History  Problem Relation Age of Onset  . Cancer Other    History  Substance Use Topics  . Smoking status: Never Smoker   . Smokeless tobacco: Not on file  . Alcohol Use: Yes     Comment: socially    Review of Systems  Musculoskeletal:       Left shoulder pain.   All other systems reviewed and are negative.     Allergies  Review of patient's allergies indicates no known allergies.  Home Medications   Prior to Admission medications   Medication Sig Start Date End Date Taking? Authorizing Provider  cyclobenzaprine (FLEXERIL) 10 MG tablet Take 1 tablet (10 mg total) by mouth 2 (two) times daily as needed for muscle spasms. 10/15/14    Elpidio Anis, PA-C  ibuprofen (ADVIL,MOTRIN) 800 MG tablet Take 1 tablet (800 mg total) by mouth 3 (three) times daily. 10/15/14   Elpidio Anis, PA-C  Mefenamic Acid 250 MG CAPS Take 1 capsule (250 mg total) by mouth 4 (four) times daily as needed. 06/02/14   Linna Hoff, MD  naproxen sodium (ALEVE) 220 MG tablet Take 1 tablet (220 mg total) by mouth 2 (two) times daily with a meal. 12/22/13   Blake Divine, MD  oxyCODONE-acetaminophen (PERCOCET/ROXICET) 5-325 MG per tablet Take 1-2 tablets by mouth every 4 (four) hours as needed for severe pain. 10/15/14   Elpidio Anis, PA-C  predniSONE (DELTASONE) 20 MG tablet Take 3 tablets (60 mg total) by mouth daily. 06/02/14   Heather Laisure, PA-C   BP 114/75 mmHg  Pulse 65  Temp(Src) 97.9 F (36.6 C) (Oral)  Resp 20  SpO2 98% Physical Exam  Constitutional: He is oriented to person, place, and time. He appears well-developed and well-nourished. No distress.  HENT:  Head: Normocephalic and atraumatic.  Eyes: Conjunctivae and EOM are normal.  Neck: Normal range of motion. Neck supple.  Cardiovascular: Normal rate, regular rhythm and normal heart sounds.   Pulmonary/Chest: Effort normal and breath sounds normal.  Musculoskeletal: He exhibits no edema.  Right shoulder TTP anteriorly over insertion of biceps. Range of motion  limited by pain, patient forcibly fighting against performing range of motion. No swelling or deformity. N/V intact distally.  Neurological: He is alert and oriented to person, place, and time.  Skin: Skin is warm and dry.  Psychiatric: He has a normal mood and affect. His behavior is normal.  Nursing note and vitals reviewed.   ED Course  Procedures (including critical care time) Labs Review Labs Reviewed - No data to display  Imaging Review No results found.   EKG Interpretation None     Medications - No data to display  2:41 PM- Treatment plan was discussed with patient who verbalizes understanding and agrees.    MDM   Final diagnoses:  Chronic right shoulder pain   NAD. VSS. No erythema, swelling or deformity. Exacerbation of chronic pain. Advised him to follow-up with Dr. Luiz BlareGraves for ultimate management of the shoulder pain which she has scheduled for surgery next month. Initially stated he only took ibuprofen at home for pain, however when I stated I could prescribe him tramadol for pain, he states "I have that too, it doesn't do anything". I advised him to continue taking the medication he has at home and follow-up with orthopedics. Stable for discharge. Return precautions given. Patient states understanding of treatment care plan and is agreeable.  I personally performed the services described in this documentation, which was scribed in my presence. The recorded information has been reviewed and is accurate.  Kathrynn SpeedRobyn M Adam Demary, PA-C 02/23/15 1458  Nelva Nayobert Beaton, MD 02/24/15 [phone]

## 2015-09-08 ENCOUNTER — Encounter (HOSPITAL_COMMUNITY): Payer: Self-pay | Admitting: Family Medicine

## 2015-09-08 ENCOUNTER — Emergency Department (HOSPITAL_COMMUNITY): Payer: No Typology Code available for payment source | Admitting: Anesthesiology

## 2015-09-08 ENCOUNTER — Emergency Department (HOSPITAL_COMMUNITY): Payer: No Typology Code available for payment source

## 2015-09-08 ENCOUNTER — Inpatient Hospital Stay (HOSPITAL_COMMUNITY)
Admission: EM | Admit: 2015-09-08 | Discharge: 2015-09-13 | DRG: 330 | Disposition: A | Discharge status: Home / Self Care | Payer: No Typology Code available for payment source | Attending: General Surgery | Admitting: General Surgery

## 2015-09-08 ENCOUNTER — Encounter (HOSPITAL_COMMUNITY): Admission: EM | Disposition: A | Discharge status: Home / Self Care | Payer: Self-pay | Source: Home / Self Care

## 2015-09-08 DIAGNOSIS — S0181XA Laceration without foreign body of other part of head, initial encounter: Secondary | ICD-10-CM | POA: Diagnosis present

## 2015-09-08 DIAGNOSIS — T07XXXA Unspecified multiple injuries, initial encounter: Secondary | ICD-10-CM

## 2015-09-08 DIAGNOSIS — R1030 Lower abdominal pain, unspecified: Secondary | ICD-10-CM | POA: Diagnosis present

## 2015-09-08 DIAGNOSIS — S36409A Unspecified injury of unspecified part of small intestine, initial encounter: Secondary | ICD-10-CM | POA: Diagnosis present

## 2015-09-08 DIAGNOSIS — S36438A Laceration of other part of small intestine, initial encounter: Secondary | ICD-10-CM | POA: Diagnosis present

## 2015-09-08 DIAGNOSIS — S36030A Superficial (capsular) laceration of spleen, initial encounter: Secondary | ICD-10-CM | POA: Diagnosis present

## 2015-09-08 DIAGNOSIS — S31114A Laceration without foreign body of abdominal wall, left lower quadrant without penetration into peritoneal cavity, initial encounter: Secondary | ICD-10-CM | POA: Diagnosis present

## 2015-09-08 DIAGNOSIS — D62 Acute posthemorrhagic anemia: Secondary | ICD-10-CM | POA: Diagnosis not present

## 2015-09-08 DIAGNOSIS — S32039A Unspecified fracture of third lumbar vertebra, initial encounter for closed fracture: Secondary | ICD-10-CM | POA: Diagnosis present

## 2015-09-08 DIAGNOSIS — S2232XA Fracture of one rib, left side, initial encounter for closed fracture: Secondary | ICD-10-CM | POA: Diagnosis present

## 2015-09-08 DIAGNOSIS — T148XXA Other injury of unspecified body region, initial encounter: Secondary | ICD-10-CM

## 2015-09-08 DIAGNOSIS — Z9049 Acquired absence of other specified parts of digestive tract: Secondary | ICD-10-CM

## 2015-09-08 HISTORY — PX: LAPAROTOMY: SHX154

## 2015-09-08 HISTORY — PX: LACERATION REPAIR: SHX5284

## 2015-09-08 LAB — COMPREHENSIVE METABOLIC PANEL
ALK PHOS: 84 U/L (ref 38–126)
ALT: 136 U/L — ABNORMAL HIGH (ref 17–63)
AST: 92 U/L — AB (ref 15–41)
Albumin: 3.6 g/dL (ref 3.5–5.0)
Anion gap: 16 — ABNORMAL HIGH (ref 5–15)
BILIRUBIN TOTAL: 0.7 mg/dL (ref 0.3–1.2)
BUN: 11 mg/dL (ref 6–20)
CALCIUM: 9.7 mg/dL (ref 8.9–10.3)
CO2: 20 mmol/L — ABNORMAL LOW (ref 22–32)
CREATININE: 1.12 mg/dL (ref 0.61–1.24)
Chloride: 105 mmol/L (ref 101–111)
GFR calc Af Amer: >60 mL/min (ref >60)
Glucose, Bld: 116 mg/dL — ABNORMAL HIGH (ref 65–99)
POTASSIUM: 3.4 mmol/L — AB (ref 3.5–5.1)
Sodium: 141 mmol/L (ref 135–145)
TOTAL PROTEIN: 7.9 g/dL (ref 6.5–8.1)

## 2015-09-08 LAB — PREPARE FRESH FROZEN PLASMA
UNIT DIVISION: 0
Unit division: 0

## 2015-09-08 LAB — I-STAT CHEM 8, ED
BUN: 12 mg/dL (ref 6–20)
CALCIUM ION: 1.15 mmol/L (ref 1.12–1.23)
CHLORIDE: 104 mmol/L (ref 101–111)
CREATININE: 1.2 mg/dL (ref 0.61–1.24)
Glucose, Bld: 118 mg/dL — ABNORMAL HIGH (ref 65–99)
HCT: 51 % (ref 39.0–52.0)
Hemoglobin: 17.3 g/dL — ABNORMAL HIGH (ref 13.0–17.0)
Potassium: 3.3 mmol/L — ABNORMAL LOW (ref 3.5–5.1)
Sodium: 143 mmol/L (ref 135–145)
TCO2: 20 mmol/L (ref 0–100)

## 2015-09-08 LAB — CBC
HCT: 45.9 % (ref 39.0–52.0)
Hemoglobin: 15.3 g/dL (ref 13.0–17.0)
MCH: 31.9 pg (ref 26.0–34.0)
MCHC: 33.3 g/dL (ref 30.0–36.0)
MCV: 95.6 fL (ref 78.0–100.0)
PLATELETS: 173 10*3/uL (ref 150–400)
RBC: 4.8 MIL/uL (ref 4.22–5.81)
RDW: 12.5 % (ref 11.5–15.5)
WBC: 8.5 10*3/uL (ref 4.0–10.5)

## 2015-09-08 LAB — TYPE AND SCREEN
ABO/RH(D): O POS
Antibody Screen: NEGATIVE
UNIT DIVISION: 0
Unit division: 0

## 2015-09-08 LAB — SAMPLE TO BLOOD BANK

## 2015-09-08 LAB — CDS SEROLOGY

## 2015-09-08 LAB — I-STAT CG4 LACTIC ACID, ED: Lactic Acid, Venous: 5.73 mmol/L (ref 0.5–2.0)

## 2015-09-08 LAB — ETHANOL: ALCOHOL ETHYL (B): 100 mg/dL — AB (ref <5)

## 2015-09-08 LAB — ABO/RH: ABO/RH(D): O POS

## 2015-09-08 LAB — PROTIME-INR
INR: 1.07 (ref 0.00–1.49)
PROTHROMBIN TIME: 14.1 s (ref 11.6–15.2)

## 2015-09-08 LAB — MRSA PCR SCREENING: MRSA BY PCR: NEGATIVE

## 2015-09-08 SURGERY — LAPAROTOMY, EXPLORATORY
Anesthesia: General | Site: Back

## 2015-09-08 MED ORDER — HEMOSTATIC AGENTS (NO CHARGE) OPTIME
TOPICAL | Status: DC | PRN
  Administered 2015-09-08: 1 via TOPICAL

## 2015-09-08 MED ORDER — SUGAMMADEX SODIUM 200 MG/2ML IV SOLN
INTRAVENOUS | Status: DC | PRN
  Administered 2015-09-08: 200 mg via INTRAVENOUS

## 2015-09-08 MED ORDER — PROPOFOL 10 MG/ML IV BOLUS
INTRAVENOUS | Status: AC
  Filled 2015-09-08: qty 20

## 2015-09-08 MED ORDER — SUGAMMADEX SODIUM 200 MG/2ML IV SOLN
INTRAVENOUS | Status: AC
  Filled 2015-09-08: qty 2

## 2015-09-08 MED ORDER — FENTANYL CITRATE (PF) 100 MCG/2ML IJ SOLN
INTRAMUSCULAR | Status: AC
  Administered 2015-09-08: 50 ug via INTRAVENOUS
  Filled 2015-09-08: qty 2

## 2015-09-08 MED ORDER — PROPOFOL 10 MG/ML IV BOLUS
INTRAVENOUS | Status: DC | PRN
  Administered 2015-09-08: 160 mg via INTRAVENOUS

## 2015-09-08 MED ORDER — PANTOPRAZOLE SODIUM 40 MG IV SOLR
40.0000 mg | Freq: Every day | INTRAVENOUS | Status: DC
  Administered 2015-09-08 – 2015-09-10 (×3): 40 mg via INTRAVENOUS
  Filled 2015-09-08 (×3): qty 40

## 2015-09-08 MED ORDER — MIDAZOLAM HCL 2 MG/2ML IJ SOLN
INTRAMUSCULAR | Status: AC
  Filled 2015-09-08: qty 4

## 2015-09-08 MED ORDER — MIDAZOLAM HCL 5 MG/5ML IJ SOLN
INTRAMUSCULAR | Status: DC | PRN
  Administered 2015-09-08: 2 mg via INTRAVENOUS

## 2015-09-08 MED ORDER — HYDROMORPHONE HCL 1 MG/ML IJ SOLN
0.2500 mg | INTRAMUSCULAR | Status: DC | PRN
  Administered 2015-09-08 (×4): 0.5 mg via INTRAVENOUS

## 2015-09-08 MED ORDER — IOHEXOL 300 MG/ML  SOLN
100.0000 mL | Freq: Once | INTRAMUSCULAR | Status: AC | PRN
  Administered 2015-09-08: 100 mL via INTRAVENOUS

## 2015-09-08 MED ORDER — ESMOLOL HCL 10 MG/ML IV SOLN
INTRAVENOUS | Status: AC
  Filled 2015-09-08: qty 10

## 2015-09-08 MED ORDER — ROCURONIUM BROMIDE 50 MG/5ML IV SOLN
INTRAVENOUS | Status: AC
  Filled 2015-09-08: qty 1

## 2015-09-08 MED ORDER — ENOXAPARIN SODIUM 40 MG/0.4ML ~~LOC~~ SOLN
40.0000 mg | SUBCUTANEOUS | Status: DC
  Administered 2015-09-09 – 2015-09-13 (×5): 40 mg via SUBCUTANEOUS
  Filled 2015-09-08 (×5): qty 0.4

## 2015-09-08 MED ORDER — SODIUM CHLORIDE 0.9 % IJ SOLN: 9.0000 mL | INTRAMUSCULAR | Status: DC | PRN

## 2015-09-08 MED ORDER — FENTANYL CITRATE (PF) 100 MCG/2ML IJ SOLN
50.0000 ug | Freq: Once | INTRAMUSCULAR | Status: AC
  Administered 2015-09-08: 50 ug via INTRAVENOUS

## 2015-09-08 MED ORDER — HYDROMORPHONE HCL 1 MG/ML IJ SOLN
INTRAMUSCULAR | Status: AC
  Filled 2015-09-08: qty 1

## 2015-09-08 MED ORDER — ONDANSETRON HCL 4 MG/2ML IJ SOLN
INTRAMUSCULAR | Status: DC | PRN
  Administered 2015-09-08: 4 mg via INTRAVENOUS

## 2015-09-08 MED ORDER — DIPHENHYDRAMINE HCL 50 MG/ML IJ SOLN: 12.5000 mg | Freq: Four times a day (QID) | INTRAMUSCULAR | Status: DC | PRN

## 2015-09-08 MED ORDER — FENTANYL CITRATE (PF) 250 MCG/5ML IJ SOLN
INTRAMUSCULAR | Status: AC
  Filled 2015-09-08: qty 5

## 2015-09-08 MED ORDER — PROMETHAZINE HCL 25 MG/ML IJ SOLN: 6.2500 mg | INTRAMUSCULAR | Status: DC | PRN

## 2015-09-08 MED ORDER — FENTANYL CITRATE (PF) 100 MCG/2ML IJ SOLN
INTRAMUSCULAR | Status: DC | PRN
  Administered 2015-09-08 (×3): 100 ug via INTRAVENOUS
  Administered 2015-09-08: 50 ug via INTRAVENOUS
  Administered 2015-09-08: 25 ug via INTRAVENOUS
  Administered 2015-09-08: 50 ug via INTRAVENOUS
  Administered 2015-09-08: 25 ug via INTRAVENOUS
  Administered 2015-09-08: 100 ug via INTRAVENOUS
  Administered 2015-09-08: 50 ug via INTRAVENOUS

## 2015-09-08 MED ORDER — ESMOLOL HCL 10 MG/ML IV SOLN
INTRAVENOUS | Status: DC | PRN
  Administered 2015-09-08: 10 mg via INTRAVENOUS
  Administered 2015-09-08 (×2): 20 mg via INTRAVENOUS
  Administered 2015-09-08: 10 mg via INTRAVENOUS

## 2015-09-08 MED ORDER — LIDOCAINE HCL (CARDIAC) 20 MG/ML IV SOLN
INTRAVENOUS | Status: DC | PRN
  Administered 2015-09-08: 80 mg via INTRAVENOUS

## 2015-09-08 MED ORDER — TETANUS-DIPHTH-ACELL PERTUSSIS 5-2.5-18.5 LF-MCG/0.5 IM SUSP: 0.5000 mL | Freq: Once | INTRAMUSCULAR | Status: DC

## 2015-09-08 MED ORDER — 0.9 % SODIUM CHLORIDE (POUR BTL) OPTIME
TOPICAL | Status: DC | PRN
  Administered 2015-09-08 (×4): 1000 mL

## 2015-09-08 MED ORDER — DIPHENHYDRAMINE HCL 12.5 MG/5ML PO ELIX: 12.5000 mg | ORAL_SOLUTION | Freq: Four times a day (QID) | ORAL | Status: DC | PRN

## 2015-09-08 MED ORDER — HYDROMORPHONE 0.3 MG/ML IV SOLN
INTRAVENOUS | Status: DC
  Administered 2015-09-08: 2.1 mg via INTRAVENOUS
  Administered 2015-09-08 (×2): via INTRAVENOUS
  Administered 2015-09-09: 4.8 mg via INTRAVENOUS
  Administered 2015-09-09: 19:00:00 via INTRAVENOUS
  Administered 2015-09-09: 2.66 mg via INTRAVENOUS
  Administered 2015-09-09: 1.2 mg via INTRAVENOUS
  Administered 2015-09-09: 09:00:00 via INTRAVENOUS
  Administered 2015-09-09: 3 mg via INTRAVENOUS
  Administered 2015-09-09: 2.7 mg via INTRAVENOUS
  Administered 2015-09-09: 2.1 mg via INTRAVENOUS
  Administered 2015-09-10: 2.4 mg via INTRAVENOUS
  Administered 2015-09-10: 0.6 mg via INTRAVENOUS
  Administered 2015-09-10: 1.2 mg via INTRAVENOUS
  Filled 2015-09-08 (×3): qty 25

## 2015-09-08 MED ORDER — TETANUS-DIPHTH-ACELL PERTUSSIS 5-2.5-18.5 LF-MCG/0.5 IM SUSP
INTRAMUSCULAR | Status: AC
  Filled 2015-09-08: qty 0.5

## 2015-09-08 MED ORDER — CEFAZOLIN SODIUM-DEXTROSE 2-3 GM-% IV SOLR: 2.0000 g | Freq: Once | INTRAVENOUS | Status: DC

## 2015-09-08 MED ORDER — LACTATED RINGERS IV SOLN
INTRAVENOUS | Status: DC | PRN
  Administered 2015-09-08 (×3): via INTRAVENOUS

## 2015-09-08 MED ORDER — ROCURONIUM BROMIDE 100 MG/10ML IV SOLN
INTRAVENOUS | Status: DC | PRN
  Administered 2015-09-08 (×2): 20 mg via INTRAVENOUS
  Administered 2015-09-08 (×4): 10 mg via INTRAVENOUS

## 2015-09-08 MED ORDER — KCL IN DEXTROSE-NACL 20-5-0.45 MEQ/L-%-% IV SOLN
INTRAVENOUS | Status: DC
  Administered 2015-09-08 – 2015-09-12 (×7): via INTRAVENOUS
  Filled 2015-09-08 (×9): qty 1000

## 2015-09-08 MED ORDER — CEFAZOLIN SODIUM-DEXTROSE 2-3 GM-% IV SOLR
2.0000 g | Freq: Three times a day (TID) | INTRAVENOUS | Status: AC
  Administered 2015-09-08 – 2015-09-09 (×4): 2 g via INTRAVENOUS
  Filled 2015-09-08 (×5): qty 50

## 2015-09-08 MED ORDER — HYDROMORPHONE 0.3 MG/ML IV SOLN
INTRAVENOUS | Status: AC
  Administered 2015-09-08: 2.4 mg
  Filled 2015-09-08: qty 25

## 2015-09-08 MED ORDER — PANTOPRAZOLE SODIUM 40 MG PO TBEC: 40.0000 mg | DELAYED_RELEASE_TABLET | Freq: Every day | ORAL | Status: DC

## 2015-09-08 MED ORDER — ONDANSETRON HCL 4 MG/2ML IJ SOLN: 4.0000 mg | Freq: Four times a day (QID) | INTRAMUSCULAR | Status: DC | PRN

## 2015-09-08 MED ORDER — NALOXONE HCL 0.4 MG/ML IJ SOLN: 0.4000 mg | INTRAMUSCULAR | Status: DC | PRN

## 2015-09-08 MED ORDER — ONDANSETRON HCL 4 MG/2ML IJ SOLN
4.0000 mg | Freq: Four times a day (QID) | INTRAMUSCULAR | Status: DC | PRN
  Administered 2015-09-10 – 2015-09-13 (×5): 4 mg via INTRAVENOUS
  Filled 2015-09-08 (×4): qty 2

## 2015-09-08 MED ORDER — PHENYLEPHRINE HCL 10 MG/ML IJ SOLN
INTRAMUSCULAR | Status: DC | PRN
  Administered 2015-09-08 (×2): 80 ug via INTRAVENOUS

## 2015-09-08 MED ORDER — DEXAMETHASONE SODIUM PHOSPHATE 4 MG/ML IJ SOLN
INTRAMUSCULAR | Status: DC | PRN
  Administered 2015-09-08: 8 mg via INTRAVENOUS

## 2015-09-08 MED ORDER — ONDANSETRON HCL 4 MG PO TABS
4.0000 mg | ORAL_TABLET | Freq: Four times a day (QID) | ORAL | Status: DC | PRN
  Administered 2015-09-11: 4 mg via ORAL
  Filled 2015-09-08 (×2): qty 1

## 2015-09-08 SURGICAL SUPPLY — 68 items
BLADE SURG ROTATE 9660 (MISCELLANEOUS) ×4 IMPLANT
CANISTER SUCTION 2500CC (MISCELLANEOUS) ×4 IMPLANT
CHLORAPREP W/TINT 26ML (MISCELLANEOUS) IMPLANT
CLOSURE WOUND 1/4 X3 (GAUZE/BANDAGES/DRESSINGS) ×1
COVER MAYO STAND STRL (DRAPES) IMPLANT
COVER SURGICAL LIGHT HANDLE (MISCELLANEOUS) ×4 IMPLANT
DRAPE LAPAROSCOPIC ABDOMINAL (DRAPES) ×4 IMPLANT
DRAPE PROXIMA HALF (DRAPES) IMPLANT
DRAPE UTILITY XL STRL (DRAPES) ×8 IMPLANT
DRAPE WARM FLUID 44X44 (DRAPE) ×4 IMPLANT
DRSG OPSITE POSTOP 4X10 (GAUZE/BANDAGES/DRESSINGS) IMPLANT
DRSG OPSITE POSTOP 4X8 (GAUZE/BANDAGES/DRESSINGS) IMPLANT
ELECT BLADE 6.5 EXT (BLADE) ×4 IMPLANT
ELECT CAUTERY BLADE 6.4 (BLADE) ×8 IMPLANT
ELECT REM PT RETURN 9FT ADLT (ELECTROSURGICAL) ×4
ELECTRODE REM PT RTRN 9FT ADLT (ELECTROSURGICAL) ×2 IMPLANT
GAUZE SPONGE 4X4 16PLY XRAY LF (GAUZE/BANDAGES/DRESSINGS) ×4 IMPLANT
GLOVE BIO SURGEON STRL SZ 6 (GLOVE) ×12 IMPLANT
GLOVE BIO SURGEON STRL SZ 6.5 (GLOVE) ×3 IMPLANT
GLOVE BIO SURGEON STRL SZ7 (GLOVE) ×8 IMPLANT
GLOVE BIO SURGEON STRL SZ7.5 (GLOVE) ×12 IMPLANT
GLOVE BIO SURGEON STRL SZ8 (GLOVE) ×12 IMPLANT
GLOVE BIO SURGEONS STRL SZ 6.5 (GLOVE) ×1
GLOVE BIOGEL PI IND STRL 6.5 (GLOVE) ×4 IMPLANT
GLOVE BIOGEL PI IND STRL 7.5 (GLOVE) ×4 IMPLANT
GLOVE BIOGEL PI IND STRL 8 (GLOVE) ×4 IMPLANT
GLOVE BIOGEL PI INDICATOR 6.5 (GLOVE) ×4
GLOVE BIOGEL PI INDICATOR 7.5 (GLOVE) ×4
GLOVE BIOGEL PI INDICATOR 8 (GLOVE) ×4
GLOVE SURG SS PI 7.0 STRL IVOR (GLOVE) ×8 IMPLANT
GOWN STRL REUS W/ TWL LRG LVL3 (GOWN DISPOSABLE) ×8 IMPLANT
GOWN STRL REUS W/ TWL XL LVL3 (GOWN DISPOSABLE) ×4 IMPLANT
GOWN STRL REUS W/TWL LRG LVL3 (GOWN DISPOSABLE) ×8
GOWN STRL REUS W/TWL XL LVL3 (GOWN DISPOSABLE) ×4
HEMOSTAT SNOW SURGICEL 2X4 (HEMOSTASIS) ×4 IMPLANT
KIT BASIN OR (CUSTOM PROCEDURE TRAY) ×4 IMPLANT
KIT ROOM TURNOVER OR (KITS) ×4 IMPLANT
LIGASURE IMPACT 36 18CM CVD LR (INSTRUMENTS) ×4 IMPLANT
NS IRRIG 1000ML POUR BTL (IV SOLUTION) ×16 IMPLANT
PACK GENERAL/GYN (CUSTOM PROCEDURE TRAY) ×4 IMPLANT
PAD ARMBOARD 7.5X6 YLW CONV (MISCELLANEOUS) ×4 IMPLANT
PENCIL BUTTON HOLSTER BLD 10FT (ELECTRODE) IMPLANT
RELOAD PROXIMATE 75MM BLUE (ENDOMECHANICALS) ×8 IMPLANT
SPECIMEN JAR LARGE (MISCELLANEOUS) ×4 IMPLANT
SPONGE GAUZE 4X4 12PLY STER LF (GAUZE/BANDAGES/DRESSINGS) ×20 IMPLANT
SPONGE LAP 18X18 X RAY DECT (DISPOSABLE) ×4 IMPLANT
STAPLER GUN LINEAR PROX 60 (STAPLE) ×4 IMPLANT
STAPLER PROXIMATE 75MM BLUE (STAPLE) ×4 IMPLANT
STAPLER VISISTAT 35W (STAPLE) ×8 IMPLANT
STRIP CLOSURE SKIN 1/4X3 (GAUZE/BANDAGES/DRESSINGS) ×3 IMPLANT
SUCTION POOLE TIP (SUCTIONS) ×4 IMPLANT
SUT PDS AB 0 CT 36 (SUTURE) ×8 IMPLANT
SUT PDS AB 1 CTX 36 (SUTURE) ×8 IMPLANT
SUT PDS AB 1 TP1 96 (SUTURE) ×8 IMPLANT
SUT PROLENE 5 0 P 3 (SUTURE) ×4 IMPLANT
SUT SILK 2 0 SH CR/8 (SUTURE) ×4 IMPLANT
SUT SILK 2 0 TIES 10X30 (SUTURE) ×4 IMPLANT
SUT SILK 3 0 SH CR/8 (SUTURE) ×4 IMPLANT
SUT SILK 3 0 TIES 10X30 (SUTURE) ×4 IMPLANT
SUT VIC AB 2-0 SH 27 (SUTURE) ×4
SUT VIC AB 2-0 SH 27XBRD (SUTURE) ×4 IMPLANT
TAPE CLOTH SURG 4X10 WHT LF (GAUZE/BANDAGES/DRESSINGS) ×12 IMPLANT
TOWEL OR 17X24 6PK STRL BLUE (TOWEL DISPOSABLE) ×8 IMPLANT
TOWEL OR 17X26 10 PK STRL BLUE (TOWEL DISPOSABLE) ×4 IMPLANT
TRAY FOLEY CATH 16FRSI W/METER (SET/KITS/TRAYS/PACK) ×4 IMPLANT
TUBE CONNECTING 12'X1/4 (SUCTIONS)
TUBE CONNECTING 12X1/4 (SUCTIONS) IMPLANT
YANKAUER SUCT BULB TIP NO VENT (SUCTIONS) IMPLANT

## 2015-09-08 NOTE — Progress Notes (Signed)
Patient was made a confidential patient on arrival to short stay. I called and spoke to ED registration per [facility] with ED registation person that done this is in custody and patient does not have be a confidential patient.

## 2015-09-08 NOTE — ED Notes (Signed)
Pt transported to CT ?

## 2015-09-08 NOTE — Transfer of Care (Signed)
Immediate Anesthesia Transfer of Care Note  Patient: Robert Spencer  Procedure(s) Performed: Procedure(s) with comments: EXPLORATORY LAPAROTOMY, REPAIR OF ABDOMINAL WALL, SMALL BOWEL RESECTION, SPLENORRHAPHY (N/A) CLOSURE OF MULTIPLE LACERATIONS (N/A) - FACE  Patient Location: PACU  Anesthesia Type:General  Level of Consciousness: awake, alert  and oriented  Airway & Oxygen Therapy: Patient Spontanous Breathing and Patient connected to nasal cannula oxygen  Post-op Assessment: Report given to RN, Post -op Vital signs reviewed and stable and Patient moving all extremities X 4  Post vital signs: Reviewed and stable  Last Vitals:  Filed Vitals:   09/08/15 1300  BP: 126/78  Pulse: 74  Temp:   Resp: 25    Complications: No apparent anesthesia complications
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
## 2015-09-08 NOTE — Anesthesia Postprocedure Evaluation (Signed)
  Anesthesia Post-op Note  Patient: Robert Spencer  Procedure(s) Performed: Procedure(s) (LRB): EXPLORATORY LAPAROTOMY, REPAIR OF ABDOMINAL WALL, SMALL BOWEL RESECTION, SPLENORRHAPHY (N/A) CLOSURE OF MULTIPLE LACERATIONS (N/A)  Patient Location: PACU  Anesthesia Type: General  Level of Consciousness: awake and alert   Airway and Oxygen Therapy: Patient Spontanous Breathing  Post-op Pain: mild  Post-op Assessment: Post-op Vital signs reviewed, Patient's Cardiovascular Status Stable, Respiratory Function Stable, Patent Airway and No signs of Nausea or vomiting  Last Vitals:  Filed Vitals:   09/08/15 1641  BP:   Pulse:   Temp: 37.5 C  Resp:     Post-op Vital Signs: stable   Complications: No apparent anesthesia complications
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
## 2015-09-08 NOTE — Anesthesia Preprocedure Evaluation (Signed)
Anesthesia Evaluation  Patient identified by MRN, date of birth, ID band Patient awake    Reviewed: Allergy & Precautions, NPO status , Patient's Chart, lab work & pertinent test results  Airway Mallampati: II  TM Distance: >3 FB Neck ROM: Full    Dental no notable dental hx.    Pulmonary neg pulmonary ROS,    Pulmonary exam normal breath sounds clear to auscultation       Cardiovascular negative cardio ROS Normal cardiovascular exam Rhythm:Regular Rate:Normal     Neuro/Psych negative neurological ROS  negative psych ROS   GI/Hepatic negative GI ROS, Neg liver ROS,   Endo/Other  negative endocrine ROS  Renal/GU negative Renal ROS  negative genitourinary   Musculoskeletal negative musculoskeletal ROS (+)   Abdominal   Peds negative pediatric ROS (+)  Hematology negative hematology ROS (+)   Anesthesia Other Findings   Reproductive/Obstetrics negative OB ROS                             Anesthesia Physical Anesthesia Plan  ASA: II and emergent  Anesthesia Plan: General   Post-op Pain Management:    Induction: Intravenous, Rapid sequence and Cricoid pressure planned  Airway Management Planned: Oral ETT  Additional Equipment:   Intra-op Plan:   Post-operative Plan: Extubation in OR  Informed Consent: I have reviewed the patients History and Physical, chart, labs and discussed the procedure including the risks, benefits and alternatives for the proposed anesthesia with the patient or authorized representative who has indicated his/her understanding and acceptance.   Dental advisory given  Plan Discussed with: CRNA and Surgeon  Anesthesia Plan Comments:         Anesthesia Quick Evaluation
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
## 2015-09-08 NOTE — Anesthesia Procedure Notes (Signed)
Procedure Name: Intubation Date/Time: 09/08/2015 1:39 PM Performed by: Bishop Limbo Pre-anesthesia Checklist: Patient identified, Emergency Drugs available, Timeout performed, Suction available and Patient being monitored Patient Re-evaluated:Patient Re-evaluated prior to inductionOxygen Delivery Method: Circle system utilized Preoxygenation: Pre-oxygenation with 100% oxygen Intubation Type: IV induction and Rapid sequence Laryngoscope Size: Mac and 4 Grade View: Grade II Tube type: Oral Tube size: 7.5 mm Number of attempts: 1 Airway Equipment and Method: Stylet Placement Confirmation: ETT inserted through vocal cords under direct vision,  breath sounds checked- equal and bilateral,  positive ETCO2 and CO2 detector Secured at: 22 cm Tube secured with: Tape Dental Injury: Teeth and Oropharynx as per pre-operative assessment  Comments: Intubation by Lanell Persons
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
## 2015-09-09 ENCOUNTER — Encounter (HOSPITAL_COMMUNITY): Payer: Self-pay | Admitting: General Surgery

## 2015-09-09 LAB — BASIC METABOLIC PANEL
ANION GAP: 8 (ref 5–15)
BUN: 7 mg/dL (ref 6–20)
CALCIUM: 8.5 mg/dL — AB (ref 8.9–10.3)
CO2: 28 mmol/L (ref 22–32)
CREATININE: 0.93 mg/dL (ref 0.61–1.24)
Chloride: 102 mmol/L (ref 101–111)
GFR calc Af Amer: >60 mL/min (ref >60)
GFR calc non Af Amer: >60 mL/min (ref >60)
GLUCOSE: 168 mg/dL — AB (ref 65–99)
Potassium: 4.2 mmol/L (ref 3.5–5.1)
Sodium: 138 mmol/L (ref 135–145)

## 2015-09-09 LAB — CBC
HEMATOCRIT: 41.1 % (ref 39.0–52.0)
Hemoglobin: 14 g/dL (ref 13.0–17.0)
MCH: 33.4 pg (ref 26.0–34.0)
MCHC: 34.1 g/dL (ref 30.0–36.0)
MCV: 98.1 fL (ref 78.0–100.0)
Platelets: 151 10*3/uL (ref 150–400)
RBC: 4.19 MIL/uL — ABNORMAL LOW (ref 4.22–5.81)
RDW: 12.8 % (ref 11.5–15.5)
WBC: 13.4 10*3/uL — ABNORMAL HIGH (ref 4.0–10.5)

## 2015-09-09 MED ORDER — CETYLPYRIDINIUM CHLORIDE 0.05 % MT LIQD: 7.0000 mL | Freq: Two times a day (BID) | OROMUCOSAL | Status: DC

## 2015-09-09 MED ORDER — HYDROMORPHONE HCL 1 MG/ML IJ SOLN
1.0000 mg | Freq: Once | INTRAMUSCULAR | Status: AC
  Administered 2015-09-09: 1 mg via INTRAVENOUS
  Filled 2015-09-09: qty 1

## 2015-09-09 MED ORDER — METHOCARBAMOL 1000 MG/10ML IJ SOLN
1000.0000 mg | Freq: Three times a day (TID) | INTRAVENOUS | Status: AC
  Administered 2015-09-09 (×3): 1000 mg via INTRAVENOUS
  Filled 2015-09-09 (×3): qty 10

## 2015-09-09 MED ORDER — CHLORHEXIDINE GLUCONATE 0.12 % MT SOLN
15.0000 mL | Freq: Two times a day (BID) | OROMUCOSAL | Status: DC
  Administered 2015-09-09: 15 mL via OROMUCOSAL

## 2015-09-09 NOTE — Progress Notes (Signed)
Pt complaining of pain "10/10 abdomen and generalized pain everywhere." Pt on PCA pump. Non-pharmacological measures used to decrease pain. Pt still uncomfortable and complaining of pain. Dr Janee Morn notified. Orders given for 1x dose of dilaudid and IV robaxin. Will administer and continue to monitor.
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
## 2015-09-09 NOTE — Care Management Note (Signed)
Case Management Note  Patient Details  Name: Robert Spencer MRN: [id number] Date of Birth: May 02, 1976  Subjective/Objective:                    Action/Plan:  Initial UR completed .  Expected Discharge Date:                  Expected Discharge Plan:     In-House Referral:     Discharge planning Services     Post Acute Care Choice:    Choice offered to:     DME Arranged:    DME Agency:     HH Arranged:    HH Agency:     Status of Service:  In process, will continue to follow  Medicare Important Message Given:    Date Medicare IM Given:    Medicare IM give by:    Date Additional Medicare IM Given:    Additional Medicare Important Message give by:     If discussed at Long Length of Stay Meetings, dates discussed:    Additional Comments:  Kingsley Plan, RN 09/09/2015, 7:24 AM
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
## 2015-09-10 LAB — CBC
HEMATOCRIT: 37.1 % — AB (ref 39.0–52.0)
Hemoglobin: 12.1 g/dL — ABNORMAL LOW (ref 13.0–17.0)
MCH: 31.9 pg (ref 26.0–34.0)
MCHC: 32.6 g/dL (ref 30.0–36.0)
MCV: 97.9 fL (ref 78.0–100.0)
PLATELETS: 140 10*3/uL — AB (ref 150–400)
RBC: 3.79 MIL/uL — ABNORMAL LOW (ref 4.22–5.81)
RDW: 12.5 % (ref 11.5–15.5)
WBC: 8.3 10*3/uL (ref 4.0–10.5)

## 2015-09-10 LAB — BASIC METABOLIC PANEL
ANION GAP: 11 (ref 5–15)
BUN: 8 mg/dL (ref 6–20)
CALCIUM: 8.4 mg/dL — AB (ref 8.9–10.3)
CO2: 29 mmol/L (ref 22–32)
Chloride: 98 mmol/L — ABNORMAL LOW (ref 101–111)
Creatinine, Ser: 0.85 mg/dL (ref 0.61–1.24)
Glucose, Bld: 113 mg/dL — ABNORMAL HIGH (ref 65–99)
Potassium: 3.7 mmol/L (ref 3.5–5.1)
SODIUM: 138 mmol/L (ref 135–145)

## 2015-09-10 MED ORDER — HYDROMORPHONE HCL 1 MG/ML IJ SOLN
0.5000 mg | INTRAMUSCULAR | Status: DC | PRN
  Administered 2015-09-11 – 2015-09-12 (×6): 0.5 mg via INTRAVENOUS
  Filled 2015-09-10 (×6): qty 1

## 2015-09-10 MED ORDER — PHENOL 1.4 % MT LIQD
1.0000 | OROMUCOSAL | Status: DC | PRN
  Filled 2015-09-10: qty 177

## 2015-09-10 MED ORDER — OXYCODONE HCL 5 MG PO TABS
5.0000 mg | ORAL_TABLET | ORAL | Status: DC | PRN
  Administered 2015-09-10 (×3): 10 mg via ORAL
  Administered 2015-09-10 – 2015-09-13 (×11): 15 mg via ORAL
  Filled 2015-09-10: qty 2
  Filled 2015-09-10 (×5): qty 3
  Filled 2015-09-10: qty 2
  Filled 2015-09-10 (×2): qty 3
  Filled 2015-09-10: qty 2
  Filled 2015-09-10 (×4): qty 3
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
## 2015-09-11 LAB — BLOOD PRODUCT ORDER (VERBAL) VERIFICATION

## 2015-09-11 LAB — CBC
HEMATOCRIT: 35.6 % — AB (ref 39.0–52.0)
HEMOGLOBIN: 11.9 g/dL — AB (ref 13.0–17.0)
MCH: 32.2 pg (ref 26.0–34.0)
MCHC: 33.4 g/dL (ref 30.0–36.0)
MCV: 96.2 fL (ref 78.0–100.0)
Platelets: 136 10*3/uL — ABNORMAL LOW (ref 150–400)
RBC: 3.7 MIL/uL — AB (ref 4.22–5.81)
RDW: 12.2 % (ref 11.5–15.5)
WBC: 5.9 10*3/uL (ref 4.0–10.5)

## 2015-09-11 MED ORDER — IBUPROFEN 400 MG PO TABS
400.0000 mg | ORAL_TABLET | Freq: Three times a day (TID) | ORAL | Status: DC
  Administered 2015-09-11 – 2015-09-13 (×7): 400 mg via ORAL
  Filled 2015-09-11 (×7): qty 1

## 2015-09-11 NOTE — Progress Notes (Signed)
Patient ID: Robert OremByron Spencer, male   DOB: Jun 10, 1976, 39 y.o.   MRN: [id number]  LOS: 3 days   Subjective: No flatus.  Hungry.  No n/v.  Walking in hallways.  Voiding.  Afebrile.  VSS.    Objective: Vital signs in last 24 hours: Temp:  [98 F (36.7 C)-98.7 F (37.1 C)] 98 F (36.7 C) (10/13 0523) Pulse Rate:  [69] 69 (10/13 0523) Resp:  [16-18] 18 (10/13 0523) BP: (129-135)/(78-94) 129/78 mmHg (10/13 0523) SpO2:  [95 %-98 %] 95 % (10/13 0523) Last BM Date: 09/07/15  Lab Results:  CBC  Recent Labs  09/10/15 0438 09/11/15 0437  WBC 8.3 5.9  HGB 12.1* 11.9*  HCT 37.1* 35.6*  PLT 140* 136*   BMET  Recent Labs  09/09/15 0320 09/10/15 0438  NA 138 138  K 4.2 3.7  CL 102 98*  CO2 28 29  GLUCOSE 168* 113*  BUN 7 8  CREATININE 0.93 0.85  CALCIUM 8.5* 8.4*    Imaging: No results found.   PE: General appearance: alert, cooperative and no distress Resp: clear to auscultation bilaterally Cardio: regular rate and rhythm, S1, S2 normal, no murmur, click, rub or gallop GI: +Bs, abdomen is soft, distended, tender.  dressing removed.  staples in place, wound edges are approximated.  no erythema.  Extremities: extremities normal, atraumatic, no cyanosis or edema Skin: multiple lacs to back and left flank-staples in place, no erythema or drainage.     Patient Active Problem List   Diagnosis Date Noted  . S/P small bowel resection 09/08/2015    Assessment/Plan POD#3 Exploratory laparotomy, repair of abdominal wall, small bowel resection, splenorrhaphy Closure of multiple lacerations - start clears, ambulate, IS, encourage PO pain meds.  Multiple lacerations-will need staples removed next week ABL anemia - mild, stable VTE - SCD's, Lovenox FEN - IVF, clears Dispo -- home 2-3 days    Ashok NorrisEmina Brayah Urquilla, ANP-BC Pager: [phone] General Trauma [place] Pager: [phone]   09/11/2015 8:58 AM

## 2015-09-11 NOTE — Progress Notes (Signed)
PT Cancellation Note  Patient Details Name: Robert Spencer MRN: [id number] DOB: 1976-04-21   Cancelled Treatment:    Reason Eval/Treat Not Completed:  (patient reports he is ambulating independently and deferred further PT) .  Will sign off.   Olivia CanterMoton, Robert Spencer M 09/11/2015, 11:23 AM

## 2015-09-12 DIAGNOSIS — T07XXXA Unspecified multiple injuries, initial encounter: Secondary | ICD-10-CM

## 2015-09-12 DIAGNOSIS — S36409A Unspecified injury of unspecified part of small intestine, initial encounter: Secondary | ICD-10-CM | POA: Diagnosis present

## 2015-09-12 DIAGNOSIS — D62 Acute posthemorrhagic anemia: Secondary | ICD-10-CM | POA: Diagnosis not present

## 2015-09-12 MED ORDER — OXYCODONE-ACETAMINOPHEN 10-325 MG PO TABS: 1.0000 | ORAL_TABLET | ORAL | Status: DC | PRN

## 2015-09-12 MED ORDER — BACITRACIN ZINC 500 UNIT/GM EX OINT
TOPICAL_OINTMENT | Freq: Two times a day (BID) | CUTANEOUS | Status: DC
  Administered 2015-09-12 (×2): 1 via TOPICAL
  Administered 2015-09-13: 09:00:00 via TOPICAL
  Filled 2015-09-12: qty 28.35

## 2015-09-12 MED ORDER — POLYETHYLENE GLYCOL 3350 17 G PO PACK
17.0000 g | PACK | Freq: Every day | ORAL | Status: DC
  Administered 2015-09-12 – 2015-09-13 (×2): 17 g via ORAL
  Filled 2015-09-12 (×2): qty 1

## 2015-09-12 MED ORDER — DOCUSATE SODIUM 100 MG PO CAPS
100.0000 mg | ORAL_CAPSULE | Freq: Two times a day (BID) | ORAL | Status: DC
  Administered 2015-09-12 – 2015-09-13 (×3): 100 mg via ORAL
  Filled 2015-09-12 (×4): qty 1

## 2015-09-12 NOTE — Progress Notes (Signed)
Patient ID: Robert OremByron Azucena, male   DOB: 06-13-1976, 39 y.o.   MRN: [id number]   LOS: 4 days   POD#4  Subjective: Tolerated clears yesterday. Denies N/V. +flatus and +BM this morning. Pain controlled with orals.   Objective: Vital signs in last 24 hours: Temp:  [97.8 F (36.6 C)-98.4 F (36.9 C)] 97.8 F (36.6 C) (10/14 0538) Pulse Rate:  [56-66] 60 (10/14 0538) Resp:  [18] 18 (10/14 0538) BP: (119-128)/(71-79) 119/79 mmHg (10/14 0538) SpO2:  [95 %-98 %] 98 % (10/14 0538) Last BM Date: 09/07/15   Physical Exam General appearance: alert and no distress Resp: clear to auscultation bilaterally Cardio: regular rate and rhythm GI: Soft, +BS, incision C/D/I   Assessment/Plan: POD#3 Exploratory laparotomy, repair of abdominal wall, small bowel resection, splenorrhaphy -- Advance diet to regular Closure of multiple lacerations -- Local care ABL anemia - mild, stable FEN - SL IV VTE - SCD's, Lovenox Dispo -- Home tomorrow if tolerates diet    Freeman CaldronMichael J. Marybelle Giraldo, PA-C Pager: [phone] General Trauma [place] Pager: [phone]  09/12/2015

## 2015-09-13 MED ORDER — ONDANSETRON HCL 4 MG PO TABS: 4.0000 mg | ORAL_TABLET | ORAL | Status: DC | PRN

## 2015-09-13 NOTE — Progress Notes (Signed)
Patient discharged to home with instructions. 

## 2015-09-13 NOTE — Discharge Instructions (Signed)
Wash wounds daily in shower with soap and water. Do not soak. Apply antibiotic ointment (e.g. Neosporin) twice daily and as needed to keep moist. Cover with dry dressing.  No driving while taking oxycodone.  No lifting more than 15 pounds for 6 weeks. [facility]      [facility], [place] [phone]  OPEN ABDOMINAL SURGERY: POST OP INSTRUCTIONS  Always review your discharge instruction sheet given to you by the facility where your surgery was performed.  IF YOU HAVE DISABILITY OR FAMILY LEAVE FORMS, YOU MUST BRING THEM TO THE OFFICE FOR PROCESSING.  PLEASE DO NOT GIVE THEM TO YOUR DOCTOR.  1. A prescription for pain medication may be given to you upon discharge.  Take your pain medication as prescribed, if needed.  If narcotic pain medicine is not needed, then you may take acetaminophen (Tylenol) or ibuprofen (Advil) as needed. 2. Take your usually prescribed medications unless otherwise directed. 3. If you need a refill on your pain medication, please contact your pharmacy. They will contact our office to request authorization.  Prescriptions will not be filled after 5pm or on week-ends. 4. You should follow a light diet the first few days after arrival home, such as soup and crackers, pudding, etc.unless your doctor has advised otherwise. A high-fiber, low fat diet can be resumed as tolerated.   Be sure to include lots of fluids daily. Most patients will experience some swelling and bruising on the chest and neck area.  Ice packs will help.  Swelling and bruising can take several days to resolve 5. Most patients will experience some swelling and bruising in the area of the incision. Ice pack will help. Swelling and bruising can take several days to resolve..  6. It is common to experience some constipation if taking pain medication after surgery.  Increasing fluid intake and taking a stool softener will usually help or prevent this problem from occurring.  A mild laxative (Milk of Magnesia  or Miralax) should be taken according to package directions if there are no bowel movements after 48 hours. 7.  You may have steri-strips (small skin tapes) in place directly over the incision.  These strips should be left on the skin for 7-10 days.  If your surgeon used skin glue on the incision, you may shower in 24 hours.  The glue will flake off over the next 2-3 weeks.  Any sutures or staples will be removed at the office during your follow-up visit. You may find that a light gauze bandage over your incision may keep your staples from being rubbed or pulled. You may shower and replace the bandage daily. 8. ACTIVITIES:  You may resume regular (light) daily activities beginning the next day--such as daily self-care, walking, climbing stairs--gradually increasing activities as tolerated.  You may have sexual intercourse when it is comfortable.  Refrain from any heavy lifting or straining until approved by your doctor. a. You may drive when you no longer are taking prescription pain medication, you can comfortably wear a seatbelt, and you can safely maneuver your car and apply brakes b. Return to Work: ___________________________________ 9. You should see your doctor in the office for a follow-up appointment approximately two weeks after your surgery.  Make sure that you call for this appointment within a day or two after you arrive home to insure a convenient appointment time. OTHER INSTRUCTIONS:  _____________________________________________________________ _____________________________________________________________  WHEN TO CALL YOUR DOCTOR: 1. Fever over 101.0 2. Inability to urinate 3. Nausea and/or vomiting 4. Extreme  swelling or bruising 5. Continued bleeding from incision. 6. Increased pain, redness, or drainage from the incision. 7. Difficulty swallowing or breathing 8. Muscle cramping or spasms. 9. Numbness or tingling in hands or feet or around lips.  The clinic staff is available  to answer your questions during regular business hours.  Please dont hesitate to call and ask to speak to one of the nurses if you have concerns.  For further questions, please visit [email or web address]
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
## 2015-09-15 ENCOUNTER — Encounter (HOSPITAL_COMMUNITY): Payer: Self-pay | Admitting: Emergency Medicine

## 2015-10-02 ENCOUNTER — Telehealth (HOSPITAL_COMMUNITY): Payer: Self-pay | Admitting: Orthopedic Surgery

## 2015-10-03 NOTE — Telephone Encounter (Signed)
Robert Spencer called in with some shooting abdominal pains and difficulty evacuating his bowels. He is already on a stool softener. I suggested increasing that to 200mg  of colace bid, 17g of Miralax bid, and 1 bottle of magnesium citrate q12h until adequate clean out. He'll call Monday if this doesn't fix the problem and we'll see him in clinic on Wednesday.

## 2015-12-12 ENCOUNTER — Emergency Department (HOSPITAL_COMMUNITY)
Admission: EM | Admit: 2015-12-12 | Discharge: 2015-12-12 | Discharge status: Against Medical Advice | Payer: 59 | Attending: Emergency Medicine | Admitting: Emergency Medicine

## 2015-12-12 ENCOUNTER — Encounter (HOSPITAL_COMMUNITY): Payer: Self-pay

## 2015-12-12 DIAGNOSIS — G8918 Other acute postprocedural pain: Secondary | ICD-10-CM | POA: Insufficient documentation

## 2015-12-12 DIAGNOSIS — Z9889 Other specified postprocedural states: Secondary | ICD-10-CM | POA: Insufficient documentation

## 2015-12-12 DIAGNOSIS — R109 Unspecified abdominal pain: Secondary | ICD-10-CM | POA: Diagnosis present

## 2015-12-12 LAB — URINALYSIS, ROUTINE W REFLEX MICROSCOPIC
Bilirubin Urine: NEGATIVE
GLUCOSE, UA: NEGATIVE mg/dL
HGB URINE DIPSTICK: NEGATIVE
KETONES UR: NEGATIVE mg/dL
Leukocytes, UA: NEGATIVE
NITRITE: NEGATIVE
PH: 5.5 (ref 5.0–8.0)
Protein, ur: NEGATIVE mg/dL
Specific Gravity, Urine: 1.013 (ref 1.005–1.030)

## 2015-12-12 LAB — COMPREHENSIVE METABOLIC PANEL
ALT: 177 U/L — AB (ref 17–63)
AST: 112 U/L — ABNORMAL HIGH (ref 15–41)
Albumin: 4 g/dL (ref 3.5–5.0)
Alkaline Phosphatase: 86 U/L (ref 38–126)
Anion gap: 11 (ref 5–15)
BILIRUBIN TOTAL: 0.9 mg/dL (ref 0.3–1.2)
BUN: 8 mg/dL (ref 6–20)
CO2: 23 mmol/L (ref 22–32)
Calcium: 8.7 mg/dL — ABNORMAL LOW (ref 8.9–10.3)
Chloride: 105 mmol/L (ref 101–111)
Creatinine, Ser: 1.13 mg/dL (ref 0.61–1.24)
GFR calc Af Amer: >60 mL/min (ref >60)
Glucose, Bld: 101 mg/dL — ABNORMAL HIGH (ref 65–99)
Potassium: 3.8 mmol/L (ref 3.5–5.1)
Sodium: 139 mmol/L (ref 135–145)
TOTAL PROTEIN: 7.7 g/dL (ref 6.5–8.1)

## 2015-12-12 LAB — CBC
HCT: 43.1 % (ref 39.0–52.0)
Hemoglobin: 14.8 g/dL (ref 13.0–17.0)
MCH: 32.4 pg (ref 26.0–34.0)
MCHC: 34.3 g/dL (ref 30.0–36.0)
MCV: 94.3 fL (ref 78.0–100.0)
Platelets: 165 10*3/uL (ref 150–400)
RBC: 4.57 MIL/uL (ref 4.22–5.81)
RDW: 12.6 % (ref 11.5–15.5)
WBC: 6.8 10*3/uL (ref 4.0–10.5)

## 2015-12-12 LAB — LIPASE, BLOOD: Lipase: 22 U/L (ref 11–51)

## 2015-12-12 MED ORDER — FENTANYL CITRATE (PF) 100 MCG/2ML IJ SOLN
INTRAMUSCULAR | Status: AC
  Filled 2015-12-12: qty 2

## 2015-12-12 MED ORDER — FENTANYL CITRATE (PF) 100 MCG/2ML IJ SOLN
50.0000 ug | Freq: Once | INTRAMUSCULAR | Status: AC
  Administered 2015-12-12: 50 ug via NASAL
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
## 2015-12-12 NOTE — ED Notes (Signed)
Upon entering room patient noted to not be in room. Per bystander patient exited room and stated he was leaving because he could not sit here anymore. Pt ambulatory. This RN unable to make patient aware of risks/benefits of leaving AMA and/or take a final set of vital signs.

## 2015-12-12 NOTE — ED Notes (Signed)
Patient states that he can not wait any longer and I am leaving.

## 2016-03-21 ENCOUNTER — Emergency Department (HOSPITAL_COMMUNITY): Payer: 59

## 2016-03-21 ENCOUNTER — Emergency Department (HOSPITAL_COMMUNITY)
Admission: EM | Admit: 2016-03-21 | Discharge: 2016-03-21 | Disposition: A | Discharge status: Home / Self Care | Payer: 59 | Attending: Emergency Medicine | Admitting: Emergency Medicine

## 2016-03-21 ENCOUNTER — Encounter (HOSPITAL_COMMUNITY): Payer: Self-pay | Admitting: Emergency Medicine

## 2016-03-21 DIAGNOSIS — Z87442 Personal history of urinary calculi: Secondary | ICD-10-CM | POA: Insufficient documentation

## 2016-03-21 DIAGNOSIS — Z8739 Personal history of other diseases of the musculoskeletal system and connective tissue: Secondary | ICD-10-CM | POA: Diagnosis not present

## 2016-03-21 DIAGNOSIS — S3991XA Unspecified injury of abdomen, initial encounter: Secondary | ICD-10-CM | POA: Insufficient documentation

## 2016-03-21 DIAGNOSIS — Y990 Civilian activity done for income or pay: Secondary | ICD-10-CM | POA: Diagnosis not present

## 2016-03-21 DIAGNOSIS — S59901A Unspecified injury of right elbow, initial encounter: Secondary | ICD-10-CM | POA: Insufficient documentation

## 2016-03-21 DIAGNOSIS — W11XXXA Fall on and from ladder, initial encounter: Secondary | ICD-10-CM | POA: Diagnosis not present

## 2016-03-21 DIAGNOSIS — G8929 Other chronic pain: Secondary | ICD-10-CM | POA: Insufficient documentation

## 2016-03-21 DIAGNOSIS — S4991XA Unspecified injury of right shoulder and upper arm, initial encounter: Secondary | ICD-10-CM | POA: Diagnosis present

## 2016-03-21 DIAGNOSIS — S42201A Unspecified fracture of upper end of right humerus, initial encounter for closed fracture: Secondary | ICD-10-CM | POA: Diagnosis not present

## 2016-03-21 DIAGNOSIS — Y9389 Activity, other specified: Secondary | ICD-10-CM | POA: Diagnosis not present

## 2016-03-21 DIAGNOSIS — Y9289 Other specified places as the place of occurrence of the external cause: Secondary | ICD-10-CM | POA: Insufficient documentation

## 2016-03-21 DIAGNOSIS — S42291A Other displaced fracture of upper end of right humerus, initial encounter for closed fracture: Secondary | ICD-10-CM

## 2016-03-21 LAB — I-STAT CHEM 8, ED
BUN: 8 mg/dL (ref 6–20)
CALCIUM ION: 1.06 mmol/L — AB (ref 1.12–1.23)
CHLORIDE: 108 mmol/L (ref 101–111)
Creatinine, Ser: 0.9 mg/dL (ref 0.61–1.24)
GLUCOSE: 90 mg/dL (ref 65–99)
HCT: 50 % (ref 39.0–52.0)
Hemoglobin: 17 g/dL (ref 13.0–17.0)
Potassium: 4.1 mmol/L (ref 3.5–5.1)
Sodium: 143 mmol/L (ref 135–145)
TCO2: 20 mmol/L (ref 0–100)

## 2016-03-21 MED ORDER — HYDROCODONE-ACETAMINOPHEN 5-325 MG PO TABS
2.0000 | ORAL_TABLET | Freq: Once | ORAL | Status: AC
  Administered 2016-03-21: 2 via ORAL
  Filled 2016-03-21: qty 2

## 2016-03-21 MED ORDER — HYDROCODONE-ACETAMINOPHEN 5-325 MG PO TABS: 1.0000 | ORAL_TABLET | ORAL | Status: DC | PRN

## 2016-03-21 MED ORDER — NAPROXEN 500 MG PO TABS: 500.0000 mg | ORAL_TABLET | Freq: Two times a day (BID) | ORAL | Status: DC
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
## 2016-03-21 NOTE — ED Notes (Signed)
Pt refused blood draw, states he hates having blood taken and would like to refuse.
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
## 2016-03-21 NOTE — Discharge Instructions (Signed)
You may have a small fracture at the top of her shoulder. Please follow-up with your doctor/orthopedist at the end of the week for regularly scheduled appointment. Take your pain medicines as prescribed and as we discussed. Do not drive or operate any machinery while taking your Norco as it can make a very drowsy. Take your naproxen for mild to moderate pain. Return to ED for any new or worsening symptoms as we discussed

## 2016-10-14 ENCOUNTER — Emergency Department (HOSPITAL_COMMUNITY)
Admission: EM | Admit: 2016-10-14 | Discharge: 2016-10-15 | Disposition: A | Discharge status: Home / Self Care | Payer: No Typology Code available for payment source | Attending: Emergency Medicine | Admitting: Emergency Medicine

## 2016-10-14 DIAGNOSIS — F17200 Nicotine dependence, unspecified, uncomplicated: Secondary | ICD-10-CM | POA: Insufficient documentation

## 2016-10-14 DIAGNOSIS — K047 Periapical abscess without sinus: Secondary | ICD-10-CM | POA: Insufficient documentation

## 2016-10-14 DIAGNOSIS — K0889 Other specified disorders of teeth and supporting structures: Secondary | ICD-10-CM

## 2016-10-14 NOTE — ED Triage Notes (Signed)
Pt is c/o toothache on the bottom right  Pt has a rotten tooth  Pt has swelling and redness to the gums and facial swelling to the right side of his face

## 2016-10-15 ENCOUNTER — Encounter (HOSPITAL_COMMUNITY): Payer: Self-pay | Admitting: Emergency Medicine

## 2016-10-15 MED ORDER — PENICILLIN V POTASSIUM 500 MG PO TABS
500.0000 mg | ORAL_TABLET | Freq: Once | ORAL | Status: AC
  Administered 2016-10-15: 500 mg via ORAL
  Filled 2016-10-15: qty 1

## 2016-10-15 MED ORDER — OXYCODONE-ACETAMINOPHEN 5-325 MG PO TABS: 1.0000 | ORAL_TABLET | ORAL | 0 refills | Status: DC | PRN

## 2016-10-15 MED ORDER — IBUPROFEN 800 MG PO TABS: 800.0000 mg | ORAL_TABLET | Freq: Three times a day (TID) | ORAL | 0 refills | Status: AC

## 2016-10-15 MED ORDER — PENICILLIN V POTASSIUM 500 MG PO TABS: 500.0000 mg | ORAL_TABLET | Freq: Three times a day (TID) | ORAL | 0 refills | Status: DC

## 2016-10-15 MED ORDER — OXYCODONE-ACETAMINOPHEN 5-325 MG PO TABS
1.0000 | ORAL_TABLET | Freq: Once | ORAL | Status: AC
Start: 2016-10-15 — End: 2016-10-15
  Administered 2016-10-15: 1 via ORAL
  Filled 2016-10-15: qty 1

## 2016-10-15 NOTE — ED Notes (Signed)
Pt ambulatory and independent at discharge.  Verbalized understanding of discharge instructions and importance of not driving while under the influence of pain medication. 
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
## 2017-03-22 ENCOUNTER — Emergency Department (HOSPITAL_COMMUNITY)
Admission: EM | Admit: 2017-03-22 | Discharge: 2017-03-22 | Disposition: A | Discharge status: Home / Self Care | Payer: No Typology Code available for payment source | Attending: Dermatology | Admitting: Dermatology

## 2017-03-22 ENCOUNTER — Encounter (HOSPITAL_COMMUNITY): Payer: Self-pay | Admitting: Emergency Medicine

## 2017-03-22 DIAGNOSIS — Z5321 Procedure and treatment not carried out due to patient leaving prior to being seen by health care provider: Secondary | ICD-10-CM | POA: Insufficient documentation

## 2017-03-22 DIAGNOSIS — M542 Cervicalgia: Secondary | ICD-10-CM | POA: Insufficient documentation

## 2017-03-22 DIAGNOSIS — R51 Headache: Secondary | ICD-10-CM | POA: Insufficient documentation

## 2017-03-22 NOTE — ED Notes (Signed)
Pt states he has had multiple surgeries on his musculoskeletal system.

## 2017-03-22 NOTE — ED Triage Notes (Signed)
Pt comes with complaints of right sided neck pain and a headache.  States he noted some swelling behind his right ear about a month ago and woke up with a headache this morning.  Pt ambulatory and A&O x4.  No distress or deficits noted at this time.   No swelling noted at this time.

## 2017-03-22 NOTE — ED Notes (Signed)
Patient called to be placed in treatment room x1.

## 2017-03-22 NOTE — ED Notes (Signed)
Called pt for room placement no response. X2

## 2017-03-22 NOTE — ED Notes (Signed)
Called pt for reasses vitals no response. 

## 2017-09-24 ENCOUNTER — Inpatient Hospital Stay (HOSPITAL_COMMUNITY)
Admission: EM | Admit: 2017-09-24 | Discharge: 2017-09-29 | DRG: 337 | Disposition: A | Discharge status: Home / Self Care | Payer: Self-pay | Attending: Surgery | Admitting: Surgery

## 2017-09-24 ENCOUNTER — Emergency Department (HOSPITAL_COMMUNITY): Payer: Self-pay

## 2017-09-24 ENCOUNTER — Encounter (HOSPITAL_COMMUNITY): Payer: Self-pay

## 2017-09-24 DIAGNOSIS — K562 Volvulus: Principal | ICD-10-CM | POA: Diagnosis present

## 2017-09-24 DIAGNOSIS — M5136 Other intervertebral disc degeneration, lumbar region: Secondary | ICD-10-CM | POA: Diagnosis present

## 2017-09-24 DIAGNOSIS — M4800 Spinal stenosis, site unspecified: Secondary | ICD-10-CM | POA: Diagnosis present

## 2017-09-24 DIAGNOSIS — Z79899 Other long term (current) drug therapy: Secondary | ICD-10-CM

## 2017-09-24 DIAGNOSIS — R42 Dizziness and giddiness: Secondary | ICD-10-CM | POA: Diagnosis present

## 2017-09-24 DIAGNOSIS — R109 Unspecified abdominal pain: Secondary | ICD-10-CM | POA: Diagnosis present

## 2017-09-24 DIAGNOSIS — R1033 Periumbilical pain: Secondary | ICD-10-CM

## 2017-09-24 DIAGNOSIS — F17200 Nicotine dependence, unspecified, uncomplicated: Secondary | ICD-10-CM | POA: Diagnosis present

## 2017-09-24 DIAGNOSIS — M25511 Pain in right shoulder: Secondary | ICD-10-CM | POA: Diagnosis present

## 2017-09-24 DIAGNOSIS — K567 Ileus, unspecified: Secondary | ICD-10-CM | POA: Diagnosis not present

## 2017-09-24 LAB — COMPREHENSIVE METABOLIC PANEL
ALK PHOS: 77 U/L (ref 38–126)
ALT: 165 U/L — AB (ref 17–63)
AST: 106 U/L — ABNORMAL HIGH (ref 15–41)
Albumin: 4.2 g/dL (ref 3.5–5.0)
Anion gap: 9 (ref 5–15)
BUN: 7 mg/dL (ref 6–20)
CALCIUM: 9.5 mg/dL (ref 8.9–10.3)
CO2: 25 mmol/L (ref 22–32)
CREATININE: 0.73 mg/dL (ref 0.61–1.24)
Chloride: 105 mmol/L (ref 101–111)
Glucose, Bld: 93 mg/dL (ref 65–99)
Potassium: 3.7 mmol/L (ref 3.5–5.1)
Sodium: 139 mmol/L (ref 135–145)
TOTAL PROTEIN: 7.9 g/dL (ref 6.5–8.1)
Total Bilirubin: 0.9 mg/dL (ref 0.3–1.2)

## 2017-09-24 LAB — URINALYSIS, ROUTINE W REFLEX MICROSCOPIC
Bilirubin Urine: NEGATIVE
Glucose, UA: NEGATIVE mg/dL
Hgb urine dipstick: NEGATIVE
Ketones, ur: NEGATIVE mg/dL
LEUKOCYTES UA: NEGATIVE
Nitrite: NEGATIVE
PROTEIN: NEGATIVE mg/dL
Specific Gravity, Urine: 1.01 (ref 1.005–1.030)
pH: 6 (ref 5.0–8.0)

## 2017-09-24 LAB — CBC
HCT: 43 % (ref 39.0–52.0)
Hemoglobin: 15 g/dL (ref 13.0–17.0)
MCH: 32.6 pg (ref 26.0–34.0)
MCHC: 34.9 g/dL (ref 30.0–36.0)
MCV: 93.5 fL (ref 78.0–100.0)
PLATELETS: 154 10*3/uL (ref 150–400)
RBC: 4.6 MIL/uL (ref 4.22–5.81)
RDW: 12.4 % (ref 11.5–15.5)
WBC: 7.1 10*3/uL (ref 4.0–10.5)

## 2017-09-24 LAB — LIPASE, BLOOD: LIPASE: 39 U/L (ref 11–51)

## 2017-09-24 MED ORDER — SODIUM CHLORIDE 0.9 % IV BOLUS (SEPSIS)
1000.0000 mL | Freq: Once | INTRAVENOUS | Status: AC
  Administered 2017-09-24: 1000 mL via INTRAVENOUS

## 2017-09-24 MED ORDER — HYDROMORPHONE HCL 1 MG/ML IJ SOLN
1.0000 mg | Freq: Once | INTRAMUSCULAR | Status: AC
  Administered 2017-09-24: 1 mg via INTRAVENOUS
  Filled 2017-09-24: qty 1

## 2017-09-24 MED ORDER — MORPHINE SULFATE (PF) 4 MG/ML IV SOLN
4.0000 mg | Freq: Once | INTRAVENOUS | Status: AC
  Administered 2017-09-24: 4 mg via INTRAVENOUS
  Filled 2017-09-24: qty 1

## 2017-09-24 MED ORDER — OMEPRAZOLE 20 MG PO CPDR: 20.0000 mg | DELAYED_RELEASE_CAPSULE | Freq: Every day | ORAL | 0 refills | Status: AC

## 2017-09-24 MED ORDER — IOPAMIDOL (ISOVUE-300) INJECTION 61%
INTRAVENOUS | Status: AC
  Administered 2017-09-24: 21:00:00
  Filled 2017-09-24: qty 100

## 2017-09-24 MED ORDER — IOPAMIDOL (ISOVUE-300) INJECTION 61%
100.0000 mL | Freq: Once | INTRAVENOUS | Status: AC | PRN
  Administered 2017-09-24: 100 mL via INTRAVENOUS

## 2017-09-24 NOTE — ED Notes (Signed)
Alyssa, PA-C informed no relief from morphine.

## 2017-09-24 NOTE — ED Notes (Signed)
Patient transported to CT 
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
## 2017-09-24 NOTE — ED Notes (Signed)
Pt with continuous pulse oximetry.
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
## 2017-09-24 NOTE — ED Provider Notes (Addendum)
Complains of midline abdominal pain at site of surgical scar onset yesterday gradually.  He denies nausea or vomiting no painful bowel movements.  No urinary symptoms.  Pain is worse with changing positions and improved with remaining still no fever.  Presently hungry.  On exam alert no distress abdomen midline surgical scar nondistended normal active bowel sounds tender overlying midline scar and immediately to left of scar.  Genitalia normal male. Pt signed out to Dr. Read DriversMolpus 1210 am.   Robert Spencer, Robert Borak, Robert Spencer 09/24/17 [id number]     Robert Spencer, Robert Missildine, Robert Spencer 09/25/17 [id number]
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
## 2017-09-25 ENCOUNTER — Encounter (HOSPITAL_COMMUNITY): Payer: Self-pay | Admitting: Surgery

## 2017-09-25 ENCOUNTER — Observation Stay (HOSPITAL_COMMUNITY): Payer: Self-pay | Admitting: Anesthesiology

## 2017-09-25 ENCOUNTER — Encounter (HOSPITAL_COMMUNITY): Admission: EM | Disposition: A | Discharge status: Home / Self Care | Payer: Self-pay | Source: Home / Self Care

## 2017-09-25 DIAGNOSIS — R109 Unspecified abdominal pain: Secondary | ICD-10-CM | POA: Diagnosis present

## 2017-09-25 HISTORY — PX: LAPAROTOMY: SHX154

## 2017-09-25 LAB — SURGICAL PCR SCREEN
MRSA, PCR: NEGATIVE
Staphylococcus aureus: POSITIVE — AB

## 2017-09-25 LAB — HIV ANTIBODY (ROUTINE TESTING W REFLEX): HIV SCREEN 4TH GENERATION: NONREACTIVE

## 2017-09-25 SURGERY — LAPAROTOMY, EXPLORATORY
Anesthesia: General | Site: Abdomen

## 2017-09-25 MED ORDER — ROCURONIUM BROMIDE 10 MG/ML (PF) SYRINGE
PREFILLED_SYRINGE | INTRAVENOUS | Status: DC | PRN
  Administered 2017-09-25 (×2): 20 mg via INTRAVENOUS
  Administered 2017-09-25: 30 mg via INTRAVENOUS

## 2017-09-25 MED ORDER — ONDANSETRON HCL 4 MG/2ML IJ SOLN: 4.0000 mg | Freq: Four times a day (QID) | INTRAMUSCULAR | Status: DC | PRN

## 2017-09-25 MED ORDER — ACETAMINOPHEN 325 MG PO TABS
650.0000 mg | ORAL_TABLET | Freq: Four times a day (QID) | ORAL | Status: DC | PRN
  Administered 2017-09-27 – 2017-09-29 (×4): 650 mg via ORAL
  Filled 2017-09-25 (×4): qty 2

## 2017-09-25 MED ORDER — LIDOCAINE 2% (20 MG/ML) 5 ML SYRINGE
INTRAMUSCULAR | Status: AC
  Filled 2017-09-25: qty 5

## 2017-09-25 MED ORDER — HYDROMORPHONE HCL 1 MG/ML IJ SOLN
1.0000 mg | Freq: Once | INTRAMUSCULAR | Status: AC
  Administered 2017-09-25: 1 mg via INTRAVENOUS
  Filled 2017-09-25: qty 1

## 2017-09-25 MED ORDER — SUGAMMADEX SODIUM 200 MG/2ML IV SOLN
INTRAVENOUS | Status: AC
  Filled 2017-09-25: qty 2

## 2017-09-25 MED ORDER — DIPHENHYDRAMINE HCL 50 MG/ML IJ SOLN: 12.5000 mg | Freq: Four times a day (QID) | INTRAMUSCULAR | Status: DC | PRN

## 2017-09-25 MED ORDER — PROMETHAZINE HCL 25 MG/ML IJ SOLN: 6.2500 mg | INTRAMUSCULAR | Status: DC | PRN

## 2017-09-25 MED ORDER — ACETAMINOPHEN 650 MG RE SUPP: 650.0000 mg | Freq: Four times a day (QID) | RECTAL | Status: DC | PRN

## 2017-09-25 MED ORDER — CHLORHEXIDINE GLUCONATE CLOTH 2 % EX PADS
6.0000 | MEDICATED_PAD | Freq: Once | CUTANEOUS | Status: AC
  Administered 2017-09-25: 6 via TOPICAL

## 2017-09-25 MED ORDER — DEXAMETHASONE SODIUM PHOSPHATE 10 MG/ML IJ SOLN
INTRAMUSCULAR | Status: DC | PRN
  Administered 2017-09-25: 10 mg via INTRAVENOUS

## 2017-09-25 MED ORDER — SODIUM CHLORIDE 0.9% FLUSH: 9.0000 mL | INTRAVENOUS | Status: DC | PRN

## 2017-09-25 MED ORDER — KETOROLAC TROMETHAMINE 30 MG/ML IJ SOLN
INTRAMUSCULAR | Status: AC
  Filled 2017-09-25: qty 1

## 2017-09-25 MED ORDER — NALOXONE HCL 0.4 MG/ML IJ SOLN: 0.4000 mg | INTRAMUSCULAR | Status: DC | PRN

## 2017-09-25 MED ORDER — SUCCINYLCHOLINE CHLORIDE 200 MG/10ML IV SOSY
PREFILLED_SYRINGE | INTRAVENOUS | Status: AC
  Filled 2017-09-25: qty 10

## 2017-09-25 MED ORDER — TRAMADOL HCL 50 MG PO TABS
50.0000 mg | ORAL_TABLET | Freq: Four times a day (QID) | ORAL | Status: DC | PRN
  Administered 2017-09-26 – 2017-09-28 (×4): 50 mg via ORAL
  Filled 2017-09-25 (×5): qty 1

## 2017-09-25 MED ORDER — PROPOFOL 10 MG/ML IV BOLUS
INTRAVENOUS | Status: AC
  Filled 2017-09-25: qty 20

## 2017-09-25 MED ORDER — DIPHENHYDRAMINE HCL 12.5 MG/5ML PO ELIX: 12.5000 mg | ORAL_SOLUTION | Freq: Four times a day (QID) | ORAL | Status: DC | PRN

## 2017-09-25 MED ORDER — SODIUM CHLORIDE 0.9 % IR SOLN
Status: DC | PRN
Start: 2017-09-25 — End: 2017-09-25
  Administered 2017-09-25 (×2): 1000 mL

## 2017-09-25 MED ORDER — MIDAZOLAM HCL 2 MG/2ML IJ SOLN
INTRAMUSCULAR | Status: AC
  Filled 2017-09-25: qty 2

## 2017-09-25 MED ORDER — ONDANSETRON HCL 4 MG/2ML IJ SOLN
INTRAMUSCULAR | Status: AC
  Filled 2017-09-25: qty 2

## 2017-09-25 MED ORDER — DEXAMETHASONE SODIUM PHOSPHATE 10 MG/ML IJ SOLN
INTRAMUSCULAR | Status: AC
  Filled 2017-09-25: qty 1

## 2017-09-25 MED ORDER — ROCURONIUM BROMIDE 50 MG/5ML IV SOSY
PREFILLED_SYRINGE | INTRAVENOUS | Status: AC
  Filled 2017-09-25: qty 5

## 2017-09-25 MED ORDER — MORPHINE SULFATE 2 MG/ML IV SOLN
INTRAVENOUS | Status: DC
  Administered 2017-09-25: 14:00:00 via INTRAVENOUS
  Administered 2017-09-25: 31.5 mg via INTRAVENOUS
  Administered 2017-09-25: 23:00:00 via INTRAVENOUS
  Administered 2017-09-26: 28.5 mg via INTRAVENOUS
  Administered 2017-09-26: 06:00:00 via INTRAVENOUS
  Administered 2017-09-26: 48 mg via INTRAVENOUS
  Filled 2017-09-25 (×3): qty 30

## 2017-09-25 MED ORDER — METHOCARBAMOL 1000 MG/10ML IJ SOLN
500.0000 mg | Freq: Once | INTRAVENOUS | Status: AC
  Administered 2017-09-25: 500 mg via INTRAVENOUS
  Filled 2017-09-25: qty 550

## 2017-09-25 MED ORDER — ONDANSETRON HCL 4 MG/2ML IJ SOLN
INTRAMUSCULAR | Status: DC | PRN
  Administered 2017-09-25: 4 mg via INTRAVENOUS

## 2017-09-25 MED ORDER — SODIUM CHLORIDE 0.9 % IV SOLN
1.0000 g | INTRAVENOUS | Status: AC
  Administered 2017-09-25: 1 g via INTRAVENOUS
  Filled 2017-09-25: qty 1

## 2017-09-25 MED ORDER — ONDANSETRON 4 MG PO TBDP: 4.0000 mg | ORAL_TABLET | Freq: Four times a day (QID) | ORAL | Status: DC | PRN

## 2017-09-25 MED ORDER — MIDAZOLAM HCL 5 MG/5ML IJ SOLN
INTRAMUSCULAR | Status: DC | PRN
  Administered 2017-09-25: 2 mg via INTRAVENOUS

## 2017-09-25 MED ORDER — HYDROMORPHONE HCL 1 MG/ML IJ SOLN
1.0000 mg | INTRAMUSCULAR | Status: DC | PRN
  Administered 2017-09-25 – 2017-09-29 (×29): 1 mg via INTRAVENOUS
  Filled 2017-09-25 (×29): qty 1

## 2017-09-25 MED ORDER — HYDROMORPHONE HCL 1 MG/ML IJ SOLN
0.2500 mg | INTRAMUSCULAR | Status: DC | PRN
  Administered 2017-09-25 (×3): 0.5 mg via INTRAVENOUS

## 2017-09-25 MED ORDER — CHLORHEXIDINE GLUCONATE CLOTH 2 % EX PADS: 6.0000 | MEDICATED_PAD | Freq: Once | CUTANEOUS | Status: AC

## 2017-09-25 MED ORDER — HYDROMORPHONE HCL 1 MG/ML IJ SOLN
INTRAMUSCULAR | Status: AC
  Filled 2017-09-25: qty 1

## 2017-09-25 MED ORDER — HYDROMORPHONE HCL 1 MG/ML IJ SOLN
0.5000 mg | INTRAMUSCULAR | Status: AC | PRN
  Administered 2017-09-25 (×2): 0.5 mg via INTRAVENOUS

## 2017-09-25 MED ORDER — HYDROMORPHONE HCL 1 MG/ML IJ SOLN
INTRAMUSCULAR | Status: AC
  Administered 2017-09-25: 0.5 mg via INTRAVENOUS
  Filled 2017-09-25: qty 2

## 2017-09-25 MED ORDER — PANTOPRAZOLE SODIUM 40 MG IV SOLR
40.0000 mg | Freq: Every day | INTRAVENOUS | Status: DC
  Administered 2017-09-25: 40 mg via INTRAVENOUS
  Filled 2017-09-25: qty 40

## 2017-09-25 MED ORDER — KCL IN DEXTROSE-NACL 20-5-0.45 MEQ/L-%-% IV SOLN
INTRAVENOUS | Status: DC
  Administered 2017-09-25: 03:00:00 via INTRAVENOUS
  Filled 2017-09-25 (×3): qty 1000

## 2017-09-25 MED ORDER — FENTANYL CITRATE (PF) 250 MCG/5ML IJ SOLN
INTRAMUSCULAR | Status: AC
  Filled 2017-09-25: qty 5

## 2017-09-25 MED ORDER — LACTATED RINGERS IV SOLN
INTRAVENOUS | Status: DC | PRN
  Administered 2017-09-25: 10:00:00 via INTRAVENOUS

## 2017-09-25 MED ORDER — PANTOPRAZOLE SODIUM 40 MG IV SOLR
40.0000 mg | Freq: Every day | INTRAVENOUS | Status: DC
  Administered 2017-09-25: 40 mg via INTRAVENOUS
  Filled 2017-09-25 (×2): qty 40

## 2017-09-25 MED ORDER — FENTANYL CITRATE (PF) 100 MCG/2ML IJ SOLN
INTRAMUSCULAR | Status: AC
  Filled 2017-09-25: qty 2

## 2017-09-25 MED ORDER — MEPERIDINE HCL 50 MG/ML IJ SOLN
INTRAMUSCULAR | Status: AC
  Administered 2017-09-25 (×2): 6.25 mg
  Filled 2017-09-25: qty 1

## 2017-09-25 MED ORDER — OXYCODONE HCL 5 MG PO TABS
5.0000 mg | ORAL_TABLET | ORAL | Status: DC | PRN
  Administered 2017-09-25 – 2017-09-26 (×5): 10 mg via ORAL
  Filled 2017-09-25 (×5): qty 2

## 2017-09-25 MED ORDER — FENTANYL CITRATE (PF) 250 MCG/5ML IJ SOLN
INTRAMUSCULAR | Status: DC | PRN
  Administered 2017-09-25: 100 ug via INTRAVENOUS
  Administered 2017-09-25: 150 ug via INTRAVENOUS
  Administered 2017-09-25: 100 ug via INTRAVENOUS

## 2017-09-25 MED ORDER — PROPOFOL 10 MG/ML IV BOLUS
INTRAVENOUS | Status: DC | PRN
  Administered 2017-09-25: 200 mg via INTRAVENOUS

## 2017-09-25 MED ORDER — SUCCINYLCHOLINE CHLORIDE 200 MG/10ML IV SOSY
PREFILLED_SYRINGE | INTRAVENOUS | Status: DC | PRN
  Administered 2017-09-25: 100 mg via INTRAVENOUS

## 2017-09-25 MED ORDER — KCL IN DEXTROSE-NACL 20-5-0.45 MEQ/L-%-% IV SOLN
INTRAVENOUS | Status: DC
  Administered 2017-09-25 – 2017-09-27 (×2): via INTRAVENOUS
  Administered 2017-09-27: 1000 mL via INTRAVENOUS
  Filled 2017-09-25 (×6): qty 1000

## 2017-09-25 MED ORDER — LIDOCAINE 2% (20 MG/ML) 5 ML SYRINGE
INTRAMUSCULAR | Status: DC | PRN
  Administered 2017-09-25: 100 mg via INTRAVENOUS

## 2017-09-25 MED ORDER — HYDROMORPHONE HCL 1 MG/ML IJ SOLN
1.0000 mg | INTRAMUSCULAR | Status: DC | PRN
  Administered 2017-09-25: 2 mg via INTRAVENOUS
  Administered 2017-09-25: 0.5 mg via INTRAVENOUS
  Administered 2017-09-25: 2 mg via INTRAVENOUS
  Administered 2017-09-25: 1 mg via INTRAVENOUS
  Administered 2017-09-25: 2 mg via INTRAVENOUS
  Filled 2017-09-25: qty 2
  Filled 2017-09-25 (×3): qty 1
  Filled 2017-09-25 (×2): qty 2

## 2017-09-25 MED ORDER — SUGAMMADEX SODIUM 200 MG/2ML IV SOLN
INTRAVENOUS | Status: DC | PRN
  Administered 2017-09-25: 200 mg via INTRAVENOUS

## 2017-09-25 SURGICAL SUPPLY — 36 items
APPLICATOR COTTON TIP 6IN STRL (MISCELLANEOUS) ×3 IMPLANT
BLADE EXTENDED COATED 6.5IN (ELECTRODE) ×3 IMPLANT
BLADE HEX COATED 2.75 (ELECTRODE) ×3 IMPLANT
CHLORAPREP W/TINT 26ML (MISCELLANEOUS) ×3 IMPLANT
COVER MAYO STAND STRL (DRAPES) IMPLANT
COVER SURGICAL LIGHT HANDLE (MISCELLANEOUS) ×3 IMPLANT
DRAPE LAPAROSCOPIC ABDOMINAL (DRAPES) ×3 IMPLANT
DRAPE WARM FLUID 44X44 (DRAPE) IMPLANT
DRSG OPSITE POSTOP 4X6 (GAUZE/BANDAGES/DRESSINGS) ×3 IMPLANT
ELECT REM PT RETURN 15FT ADLT (MISCELLANEOUS) ×3 IMPLANT
GAUZE SPONGE 4X4 12PLY STRL (GAUZE/BANDAGES/DRESSINGS) ×3 IMPLANT
GLOVE BIOGEL PI IND STRL 7.0 (GLOVE) ×1 IMPLANT
GLOVE BIOGEL PI INDICATOR 7.0 (GLOVE) ×2
GLOVE SURG ORTHO 8.0 STRL STRW (GLOVE) ×3 IMPLANT
GOWN STRL REUS W/TWL LRG LVL3 (GOWN DISPOSABLE) ×3 IMPLANT
GOWN STRL REUS W/TWL XL LVL3 (GOWN DISPOSABLE) ×6 IMPLANT
HANDLE SUCTION POOLE (INSTRUMENTS) ×1 IMPLANT
KIT BASIN OR (CUSTOM PROCEDURE TRAY) ×3 IMPLANT
NS IRRIG 1000ML POUR BTL (IV SOLUTION) ×3 IMPLANT
PACK GENERAL/GYN (CUSTOM PROCEDURE TRAY) ×3 IMPLANT
SPONGE LAP 18X18 X RAY DECT (DISPOSABLE) IMPLANT
STAPLER VISISTAT 35W (STAPLE) ×3 IMPLANT
SUCTION POOLE HANDLE (INSTRUMENTS) ×3
SUT NOV 1 T60/GS (SUTURE) ×3 IMPLANT
SUT SILK 2 0 (SUTURE)
SUT SILK 2 0 SH CR/8 (SUTURE) IMPLANT
SUT SILK 2-0 18XBRD TIE 12 (SUTURE) IMPLANT
SUT SILK 3 0 (SUTURE) ×2
SUT SILK 3 0 SH CR/8 (SUTURE) IMPLANT
SUT SILK 3-0 18XBRD TIE 12 (SUTURE) ×1 IMPLANT
SUT VICRYL 2 0 18  UND BR (SUTURE)
SUT VICRYL 2 0 18 UND BR (SUTURE) IMPLANT
TOWEL OR 17X26 10 PK STRL BLUE (TOWEL DISPOSABLE) ×6 IMPLANT
TRAY FOLEY W/METER SILVER 16FR (SET/KITS/TRAYS/PACK) ×3 IMPLANT
WATER STERILE IRR 1500ML POUR (IV SOLUTION) ×3 IMPLANT
YANKAUER SUCT BULB TIP NO VENT (SUCTIONS) ×3 IMPLANT

## 2017-09-25 NOTE — Progress Notes (Signed)
MD notified of pts pulse being 32. Pts pain level at a 10 at the time. Orders to give pain medication and get EKG on pt.

## 2017-09-25 NOTE — Progress Notes (Signed)
General Surgery [name]  Assessment & Plan: Acute abdominal pain - rule out internal hernia vs closed loop obstruction  Persistent pain this AM despite narcotics  Bradycardia - EKG pending  NPO  Plan ex lap with LOA, possible small bowel resection.  The risks and benefits of the procedure have been discussed at length with the patient.  The patient understands the proposed procedure, potential alternative treatments, and the course of recovery to be expected.  All of the patient's questions have been answered at this time.  The patient wishes to proceed with surgery.         [name], MD, [facility]       Office: [phone]    Chief Complaint: Abdominal pain  Subjective: Patient in bed, in pain.  Unchanged to worse than earlier this AM despite pain Rx.  No nausea or emesis.  Objective: Vital signs in last 24 hours: Temp:  [97.8 F (36.6 C)-98.1 F (36.7 C)] 97.8 F (36.6 C) (10/28 0215) Pulse Rate:  [32-80] 32 (10/28 0629) Resp:  [14-18] 15 (10/28 0629) BP: (111-118)/(62-83) 113/62 (10/28 0629) SpO2:  [94 %-100 %] 98 % (10/28 0629) Weight:  [90.7 kg (200 lb)] 90.7 kg (200 lb) (10/27 1929) Last BM Date: 09/25/17  Intake/Output from previous day: 10/27 0701 - 10/28 0700 In: 1427.1 [I.V.:427.1; IV Piggyback:1000] Out: -  Intake/Output this shift: No intake/output data recorded.  Physical Exam: HEENT - sclerae clear, mucous membranes moist Neck - soft Chest - clear bilaterally Cor - RRR Abdomen - soft, tender in mid left abdomen, no mass Ext - no edema, non-tender Neuro - alert & oriented, no focal deficits  Lab Results:   Recent Labs  09/24/17 1939  WBC 7.1  HGB 15.0  HCT 43.0  PLT 154   BMET  Recent Labs  09/24/17 1939  NA 139  K 3.7  CL 105  CO2 25  GLUCOSE 93  BUN 7  CREATININE 0.73  CALCIUM 9.5   PT/INR No results for input(s): LABPROT, INR in the last 72  hours. Comprehensive Metabolic Panel:    Component Value Date/Time   NA 139 09/24/2017 1939   NA 143 03/21/2016 1744   K 3.7 09/24/2017 1939   K 4.1 03/21/2016 1744   CL 105 09/24/2017 1939   CL 108 03/21/2016 1744   CO2 25 09/24/2017 1939   CO2 23 12/12/2015 0105   BUN 7 09/24/2017 1939   BUN 8 03/21/2016 1744   CREATININE 0.73 09/24/2017 1939   CREATININE 0.90 03/21/2016 1744   GLUCOSE 93 09/24/2017 1939   GLUCOSE 90 03/21/2016 1744   CALCIUM 9.5 09/24/2017 1939   CALCIUM 8.7 (L) 12/12/2015 0105   AST 106 (H) 09/24/2017 1939   AST 112 (H) 12/12/2015 0105   ALT 165 (H) 09/24/2017 1939   ALT 177 (H) 12/12/2015 0105   ALKPHOS 77 09/24/2017 1939   ALKPHOS 86 12/12/2015 0105   BILITOT 0.9 09/24/2017 1939   BILITOT 0.9 12/12/2015 0105   PROT 7.9 09/24/2017 1939   PROT 7.7 12/12/2015 0105   ALBUMIN 4.2 09/24/2017 1939   ALBUMIN 4.0 12/12/2015 0105    Studies/Results: Ct Abdomen Pelvis W Contrast  Result Date: 09/24/2017 CLINICAL DATA:  Central abdominal pain radiating to left ribs. EXAM: CT ABDOMEN AND PELVIS WITH CONTRAST TECHNIQUE: Multidetector CT imaging of the abdomen and pelvis was performed using the standard protocol following bolus administration of intravenous contrast. CONTRAST:  100mL ISOVUE-300 IOPAMIDOL (ISOVUE-300) INJECTION 61% COMPARISON:  10/15/2014 FINDINGS: Lower chest: Mild dependent bibasilar atelectatic change. Hepatobiliary: Within normal. Pancreas: Within normal. Spleen: Within normal. Adrenals/Urinary Tract: Adrenal glands are normal. Kidneys are normal in size without hydronephrosis. 2 mm stone over the upper pole collecting system of the right kidney. Ureters and bladder are normal. Stomach/Bowel: Stomach is within normal. There is a surgical suture line over a small bowel loop in the right lower abdomen. Twisting of the mesentery in the mid abdomen just above the surgical site of the small bowel loop likely postsurgical. Appendix is normal. Colon is  normal. Vascular/Lymphatic: Within normal. Reproductive: Within normal. Other: No free fluid or focal inflammatory change. No free peritoneal air. No abdominal wall hernia. Musculoskeletal: Mild degenerate change of the spine and hips. Chronic stable mild compression fracture of T12. IMPRESSION: No acute findings in the abdomen/pelvis. Nonobstructing 2 mm right renal stone. Postsurgical change over the small bowel in the right lower abdomen. Stable T12 compression fracture. Electronically Signed   By: Elberta Fortisaniel  Boyle M.D.   On: 09/24/2017 21:48      Lasheka Kempner M 09/25/2017  Patient ID: Robert OremByron Spencer, male   DOB: [birth year], 41 y.o.   MRN: [id number]
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
## 2017-09-25 NOTE — Progress Notes (Signed)
PACU Nursing Note: Phone call made to CCS MD on call in regards to patients cont complaints of abdominal pain, rating pain a 10 on 0-10 scale. Informed MD that patient points to RLQ area. Informed also of conversation had with MDA and medications rec in PACU. Order rec for PCA. Pt informed of PCA order and explained PCA to pt

## 2017-09-25 NOTE — Brief Op Note (Signed)
09/24/2017 - 09/25/2017  11:16 AM  PATIENT:  Robert OremByron Spencer  41 y.o. male  PRE-OPERATIVE DIAGNOSIS:  small bowel obstruction  POST-OPERATIVE DIAGNOSIS:  small bowel torsion  PROCEDURE:  Procedure(s): EXPLORATORY LAPAROTOMY, LYSIS OF ADHESIONS (N/A)  SURGEON:  Surgeon(s) and Role:    * Darnell LevelGerkin, Brandon Wiechman, MD - Primary    * Griselda Mineroth, Paul III, MD - Assisting  ANESTHESIA:   general  EBL:  50 mL   BLOOD ADMINISTERED:none  DRAINS: none   LOCAL MEDICATIONS USED:  NONE  SPECIMEN:  No Specimen  DISPOSITION OF SPECIMEN:  N/A  COUNTS:  YES  TOURNIQUET:  * No tourniquets in log *  DICTATION: .Other Dictation: Dictation Number [id number]  PLAN OF CARE: Admit to inpatient   PATIENT DISPOSITION:  PACU - hemodynamically stable.   Delay start of Pharmacological VTE agent (>24hrs) due to surgical blood loss or risk of bleeding: yes  Darnell Levelodd Hajar Penninger, MD [facility] Office: [phone]

## 2017-09-25 NOTE — Anesthesia Procedure Notes (Signed)
Procedure Name: Intubation Date/Time: 09/25/2017 10:11 AM Performed by: [place] Christian Treadway C Pre-anesthesia Checklist: Patient identified, Emergency Drugs available, Suction available and Patient being monitored Patient Re-evaluated:Patient Re-evaluated prior to induction Oxygen Delivery Method: Circle system utilized Preoxygenation: Pre-oxygenation with 100% oxygen Induction Type: IV induction Ventilation: Mask ventilation without difficulty Laryngoscope Size: Mac and 4 Grade View: Grade II Tube type: Oral Tube size: 7.5 mm Number of attempts: 1 Airway Equipment and Method: Stylet and Oral airway Placement Confirmation: ETT inserted through vocal cords under direct vision,  positive ETCO2 and breath sounds checked- equal and bilateral Secured at: 22 cm Tube secured with: Tape Dental Injury: Teeth and Oropharynx as per pre-operative assessment

## 2017-09-25 NOTE — Anesthesia Preprocedure Evaluation (Addendum)
Anesthesia Evaluation  Patient identified by MRN, date of birth, ID band Patient awake    Reviewed: Allergy & Precautions, NPO status , Patient's Chart, lab work & pertinent test results  Airway Mallampati: II  TM Distance: >3 FB Neck ROM: Full    Dental  (+) Teeth Intact, Dental Advisory Given   Pulmonary Current Smoker,    Pulmonary exam normal breath sounds clear to auscultation       Cardiovascular negative cardio ROS   Rhythm:Regular Rate:Bradycardia     Neuro/Psych Spinal stenosis  negative psych ROS   GI/Hepatic negative GI ROS, (+)     substance abuse  marijuana use,   Endo/Other  negative endocrine ROS  Renal/GU negative Renal ROS     Musculoskeletal  (+) Arthritis ,   Abdominal   Peds  Hematology negative hematology ROS (+)   Anesthesia Other Findings Day of surgery medications reviewed with the patient.  Reproductive/Obstetrics                           Anesthesia Physical Anesthesia Plan  ASA: II and emergent  Anesthesia Plan: General   Post-op Pain Management:    Induction: Intravenous, Cricoid pressure planned and Rapid sequence  PONV Risk Score and Plan: 2 and Ondansetron, Dexamethasone and Midazolam  Airway Management Planned: Oral ETT  Additional Equipment:   Intra-op Plan:   Post-operative Plan: Possible Post-op intubation/ventilation  Informed Consent: I have reviewed the patients History and Physical, chart, labs and discussed the procedure including the risks, benefits and alternatives for the proposed anesthesia with the patient or authorized representative who has indicated his/her understanding and acceptance.   Dental advisory given  Plan Discussed with: CRNA  Anesthesia Plan Comments: (Risks/benefits of general anesthesia discussed with patient including risk of damage to teeth, lips, gum, and tongue, nausea/vomiting, allergic reactions to  medications, and the possibility of heart attack, stroke and death.  All patient questions answered.  Patient wishes to proceed.)       Anesthesia Quick Evaluation

## 2017-09-25 NOTE — Transfer of Care (Signed)
Immediate Anesthesia Transfer of Care Note  Patient: Robert OremByron Spencer  Procedure(s) Performed: EXPLORATORY LAPAROTOMY, LYSIS OF ADHESIONS (N/A Abdomen)  Patient Location: PACU  Anesthesia Type:General  Level of Consciousness: awake, alert  and oriented  Airway & Oxygen Therapy: Patient Spontanous Breathing and Patient connected to nasal cannula oxygen  Post-op Assessment: Report given to RN and Post -op Vital signs reviewed and stable  Post vital signs: Reviewed and stable  Last Vitals:  Vitals:   09/25/17 1115 09/25/17 1117  BP: (!) 143/114 (!) 141/93  Pulse: 86 71  Resp: (!) 21   Temp: (P) 36.7 C   SpO2: 100% 100%    Last Pain:  Vitals:   09/25/17 1115  TempSrc:   PainSc: 10-Worst pain ever      Patients Stated Pain Goal: 5 (09/25/17 16100649)  Complications: No apparent anesthesia complications

## 2017-09-25 NOTE — ED Notes (Signed)
Dr. Gerrit FriendsGerkin in with pt.

## 2017-09-26 ENCOUNTER — Encounter (HOSPITAL_COMMUNITY): Payer: Self-pay | Admitting: Surgery

## 2017-09-26 MED ORDER — METHOCARBAMOL 500 MG PO TABS
500.0000 mg | ORAL_TABLET | Freq: Three times a day (TID) | ORAL | Status: DC
  Administered 2017-09-26 – 2017-09-29 (×10): 500 mg via ORAL
  Filled 2017-09-26 (×10): qty 1

## 2017-09-26 MED ORDER — CHLORHEXIDINE GLUCONATE CLOTH 2 % EX PADS
6.0000 | MEDICATED_PAD | Freq: Every day | CUTANEOUS | Status: DC
  Administered 2017-09-27 – 2017-09-28 (×2): 6 via TOPICAL

## 2017-09-26 MED ORDER — IBUPROFEN 200 MG PO TABS
400.0000 mg | ORAL_TABLET | Freq: Three times a day (TID) | ORAL | Status: DC
Start: 2017-09-26 — End: 2017-09-29
  Administered 2017-09-26 – 2017-09-29 (×10): 400 mg via ORAL
  Filled 2017-09-26 (×10): qty 2

## 2017-09-26 MED ORDER — OXYCODONE HCL 5 MG PO TABS
5.0000 mg | ORAL_TABLET | ORAL | Status: DC | PRN
  Administered 2017-09-26 – 2017-09-28 (×12): 10 mg via ORAL
  Administered 2017-09-28: 5 mg via ORAL
  Administered 2017-09-28 – 2017-09-29 (×2): 10 mg via ORAL
  Filled 2017-09-26 (×16): qty 2

## 2017-09-26 MED ORDER — PANTOPRAZOLE SODIUM 40 MG PO TBEC
40.0000 mg | DELAYED_RELEASE_TABLET | Freq: Every day | ORAL | Status: DC
  Administered 2017-09-26 – 2017-09-28 (×3): 40 mg via ORAL
  Filled 2017-09-26 (×3): qty 1

## 2017-09-26 MED ORDER — MUPIROCIN 2 % EX OINT
1.0000 "application " | TOPICAL_OINTMENT | Freq: Two times a day (BID) | CUTANEOUS | Status: DC
  Administered 2017-09-26 – 2017-09-29 (×7): 1 via NASAL
  Filled 2017-09-26 (×2): qty 22

## 2017-09-26 MED ORDER — ENOXAPARIN SODIUM 40 MG/0.4ML ~~LOC~~ SOLN
40.0000 mg | SUBCUTANEOUS | Status: DC
  Administered 2017-09-26 – 2017-09-28 (×3): 40 mg via SUBCUTANEOUS
  Filled 2017-09-26 (×3): qty 0.4
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
## 2017-09-26 NOTE — Anesthesia Postprocedure Evaluation (Signed)
Anesthesia Post Note  Patient: Robert Spencer  Procedure(s) Performed: EXPLORATORY LAPAROTOMY, LYSIS OF ADHESIONS (N/A Abdomen)     Patient location during evaluation: PACU Anesthesia Type: General Level of consciousness: awake and alert Pain control: Patient reports 10/10 pain, but resting comfortably in bed. VSS. Patient with chronic pain issues on prescribed narcotics prior to surgery. Vital Signs Assessment: post-procedure vital signs reviewed and stable Respiratory status: spontaneous breathing, nonlabored ventilation, respiratory function stable and patient connected to nasal cannula oxygen Cardiovascular status: blood pressure returned to baseline and stable Postop Assessment: no apparent nausea or vomiting Anesthetic complications: no    Last Vitals:  Vitals:   09/26/17 0624 09/26/17 1001  BP:  123/72  Pulse:  (!) 50  Resp: (!) 27 14  Temp:  36.4 C  SpO2: 99% 97%    Last Pain:  Vitals:   09/26/17 1100  TempSrc:   PainSc: 9                  Cecile HearingStephen Edward Turk
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
## 2017-09-27 NOTE — Progress Notes (Signed)
Assessment SBO, small bowel torsion - S/P Exploratory laparotomy, lysis of adhesions, Dr. Gerrit FriendsGerkin, 10/28-still with ileus   Right shoulder pain-has had operations on the shoulder; think this may be from positioning in OR instead of gas pains   Plan:  Ice pack to shoulder.  Waiting for ileus to resolve before advancing diet.   LOS: 1 day     2 Days Post-Op  Chief Complaint/Subjective: No BM or flatus.  Incision pain is better.  Still with right shoulder pain.  Has had previous surgery on right shoulder.  Objective: Vital signs in last 24 hours: Temp:  [97.6 F (36.4 C)-98.3 F (36.8 C)] 98 F (36.7 C) (10/30 0500) Pulse Rate:  [50-55] 55 (10/30 0500) Resp:  [14-18] 16 (10/30 0500) BP: (109-128)/(60-72) 119/60 (10/30 0500) SpO2:  [95 %-97 %] 96 % (10/30 0500) Last BM Date: 09/23/17  Intake/Output from previous day: 10/29 0701 - 10/30 0700 In: 4066.7 [P.O.:2640; I.V.:1426.7] Out: 1225 [Urine:1225] Intake/Output this shift: No intake/output data recorded.  PE: General- In NAD.  Awake and alert. Abdomen-soft, some distension, wound clean, hypoactive bowel sounds.  Lab Results:   Recent Labs  09/24/17 1939  WBC 7.1  HGB 15.0  HCT 43.0  PLT 154   BMET  Recent Labs  09/24/17 1939  NA 139  K 3.7  CL 105  CO2 25  GLUCOSE 93  BUN 7  CREATININE 0.73  CALCIUM 9.5   PT/INR No results for input(s): LABPROT, INR in the last 72 hours. Comprehensive Metabolic Panel:    Component Value Date/Time   NA 139 09/24/2017 1939   NA 143 03/21/2016 1744   K 3.7 09/24/2017 1939   K 4.1 03/21/2016 1744   CL 105 09/24/2017 1939   CL 108 03/21/2016 1744   CO2 25 09/24/2017 1939   CO2 23 12/12/2015 0105   BUN 7 09/24/2017 1939   BUN 8 03/21/2016 1744   CREATININE 0.73 09/24/2017 1939   CREATININE 0.90 03/21/2016 1744   GLUCOSE 93 09/24/2017 1939   GLUCOSE 90 03/21/2016 1744   CALCIUM 9.5 09/24/2017 1939   CALCIUM 8.7 (L) 12/12/2015 0105   AST 106 (H) 09/24/2017 1939    AST 112 (H) 12/12/2015 0105   ALT 165 (H) 09/24/2017 1939   ALT 177 (H) 12/12/2015 0105   ALKPHOS 77 09/24/2017 1939   ALKPHOS 86 12/12/2015 0105   BILITOT 0.9 09/24/2017 1939   BILITOT 0.9 12/12/2015 0105   PROT 7.9 09/24/2017 1939   PROT 7.7 12/12/2015 0105   ALBUMIN 4.2 09/24/2017 1939   ALBUMIN 4.0 12/12/2015 0105     Studies/Results: No results found.  Anti-infectives: Anti-infectives    Start     Dose/Rate Route Frequency Ordered Stop   09/25/17 0930  ertapenem (INVANZ) 1 g in sodium chloride 0.9 % 50 mL IVPB     1 g 100 mL/hr over 30 Minutes Intravenous On call to O.R. 09/25/17 0917 09/25/17 1039       Chaunte Hornbeck J 09/27/2017

## 2017-09-28 MED ORDER — DOCUSATE SODIUM 100 MG PO CAPS
100.0000 mg | ORAL_CAPSULE | Freq: Every day | ORAL | Status: DC
  Administered 2017-09-28 – 2017-09-29 (×2): 100 mg via ORAL
  Filled 2017-09-28 (×2): qty 1

## 2017-09-28 MED ORDER — POLYETHYLENE GLYCOL 3350 17 G PO PACK
17.0000 g | PACK | Freq: Every day | ORAL | Status: DC
  Administered 2017-09-29: 17 g via ORAL
  Filled 2017-09-28: qty 1
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
## 2017-09-29 MED ORDER — METHOCARBAMOL 500 MG PO TABS: 500.0000 mg | ORAL_TABLET | Freq: Three times a day (TID) | ORAL | 0 refills | Status: AC | PRN

## 2017-09-29 MED ORDER — TRAMADOL HCL 50 MG PO TABS: 50.0000 mg | ORAL_TABLET | Freq: Four times a day (QID) | ORAL | 0 refills | Status: AC | PRN

## 2017-09-29 MED ORDER — OXYCODONE HCL 5 MG PO TABS: 5.0000 mg | ORAL_TABLET | ORAL | 0 refills | Status: AC | PRN

## 2017-09-29 NOTE — Progress Notes (Signed)
Discharge instructions given. Pt verbalized understanding and all questions were answered.  

## 2017-09-29 NOTE — Discharge Instructions (Signed)
DO NOT DRIVE while taking Robaxin, Oxycodone or Tramadol. These medications can cause drowsiness.   [facility]      [facility], [place] [phone]  OPEN ABDOMINAL SURGERY: POST OP INSTRUCTIONS  Always review your discharge instruction sheet given to you by the facility where your surgery was performed.  IF YOU HAVE DISABILITY OR FAMILY LEAVE FORMS, YOU MUST BRING THEM TO THE OFFICE FOR PROCESSING.  PLEASE DO NOT GIVE THEM TO YOUR DOCTOR.  1. A prescription for pain medication may be given to you upon discharge.  Take your pain medication as prescribed, if needed.  If narcotic pain medicine is not needed, then you may take acetaminophen (Tylenol) or ibuprofen (Advil) as needed. 2. Take your usually prescribed medications unless otherwise directed. 3. If you need a refill on your pain medication, please contact your pharmacy. They will contact our office to request authorization.  Prescriptions will not be filled after 5pm or on week-ends. 4. You should follow a light diet the first few days after arrival home, such as soup and crackers, pudding, etc.unless your doctor has advised otherwise. A high-fiber, low fat diet can be resumed as tolerated.   Be sure to include lots of fluids daily. Most patients will experience some swelling and bruising on the chest and neck area.  Ice packs will help.  Swelling and bruising can take several days to resolve 5. Most patients will experience some swelling and bruising in the area of the incision. Ice pack will help. Swelling and bruising can take several days to resolve..  6. It is common to experience some constipation if taking pain medication after surgery.  Increasing fluid intake and taking a stool softener will usually help or prevent this problem from occurring.  A mild laxative (Milk of Magnesia or Miralax) should be taken according to package directions if there are no bowel movements after 48 hours. 7.  You may have steri-strips (small skin tapes)  in place directly over the incision.  These strips should be left on the skin for 7-10 days.  If your surgeon used skin glue on the incision, you may shower in 24 hours.  The glue will flake off over the next 2-3 weeks.  Any sutures or staples will be removed at the office during your follow-up visit. You may find that a light gauze bandage over your incision may keep your staples from being rubbed or pulled. You may shower and replace the bandage daily. 8. ACTIVITIES:  You may resume regular (light) daily activities beginning the next day--such as daily self-care, walking, climbing stairs--gradually increasing activities as tolerated.  You may have sexual intercourse when it is comfortable.  Refrain from any heavy lifting or straining until approved by your doctor. a. You may drive when you no longer are taking prescription pain medication, you can comfortably wear a seatbelt, and you can safely maneuver your car and apply brakes b. Return to Work: when approved by your doctor. We recommend 2 weeks out of work from the date of surgery and return to work with light duties. No heavy lifting, pulling or pushing greater than 20lbs. 9. You should see your doctor in the office for a follow-up appointment approximately two weeks after your surgery.  Make sure that you call for this appointment within a day or two after you arrive home to insure a convenient appointment time.  WHEN TO CALL YOUR DOCTOR: 1. Fever over 101.0 2. Inability to urinate 3. Nausea and/or vomiting 4. Extreme swelling or  bruising 5. Continued bleeding from incision. 6. Increased pain, redness, or drainage from the incision. 7. Difficulty swallowing or breathing 8. Muscle cramping or spasms. 9. Numbness or tingling in hands or feet or around lips.  The clinic staff is available to answer your questions during regular business hours.  Please dont hesitate to call and ask to speak to one of the nurses if you have concerns.  For  further questions, please visit [email or web address]   Soft-Food Meal Plan Follow for 3-4 days after discharge A soft-food meal plan includes foods that are safe and easy to swallow. This meal plan typically is used:  If you are having trouble chewing or swallowing foods.  As a transition meal plan after only having had liquid meals for a long period.  What do I need to know about the soft-food meal plan? A soft-food meal plan includes tender foods that are soft and easy to chew and swallow. In most cases, bite-sized pieces of food are easier to swallow. A bite-sized piece is about  inch or smaller. Foods in this plan do not need to be ground or pureed. Foods that are very hard, crunchy, or sticky should be avoided. Also, breads, cereals, yogurts, and desserts with nuts, seeds, or fruits should be avoided. What foods can I eat? Grains Rice and wild rice. Moist bread, dressing, pasta, and noodles. Well-moistened dry or cooked cereals, such as farina (cooked wheat cereal), oatmeal, or grits. Biscuits, breads, muffins, pancakes, and waffles that have been well moistened. Vegetables Shredded lettuce. Cooked, tender vegetables, including potatoes without skins. Vegetable juices. Broths or creamed soups made with vegetables that are not stringy or chewy. Strained tomatoes (without seeds). Fruits Canned or well-cooked fruits. Soft (ripe), peeled fresh fruits, such as peaches, nectarines, kiwi, cantaloupe, honeydew melon, and watermelon (without seeds). Soft berries with small seeds, such as strawberries. Fruit juices (without pulp). Meats and Other Protein Sources Moist, tender, lean beef. Mutton. Lamb. Veal. Chicken. [place]. Liver. Ham. Fish without bones. Eggs. Dairy Milk, milk drinks, and cream. Plain cream cheese and cottage cheese. Plain yogurt. Sweets/Desserts Flavored gelatin desserts. Custard. Plain ice cream, frozen yogurt, sherbet, milk shakes, and malts. Plain cakes and  cookies. Plain hard candy. Other Butter, margarine (without trans fat), and cooking oils. Mayonnaise. Cream sauces. Mild spices, salt, and sugar. Syrup, molasses, honey, and jelly. The items listed above may not be a complete list of recommended foods or beverages. Contact your dietitian for more options. What foods are not recommended? Grains Dry bread, toast, crackers that have not been moistened. Coarse or dry cereals, such as bran, granola, and shredded wheat. Tough or chewy crusty breads, such as [place] bread or baguettes. Vegetables Corn. Raw vegetables except shredded lettuce. Cooked vegetables that are tough or stringy. Tough, crisp, fried potatoes and potato skins. Fruits Fresh fruits with skins or seeds or both, such as apples, pears, or grapes. Stringy, high-pulp fruits, such as papaya, pineapple, coconut, or mango. Fruit leather, fruit roll-ups, and all dried fruits. Meats and Other Protein Sources Sausages and hot dogs. Meats with gristle. Fish with bones. Nuts, seeds, and chunky peanut or other nut butters. Sweets/Desserts Cakes or cookies that are very dry or chewy. The items listed above may not be a complete list of foods and beverages to avoid. Contact your dietitian for more information. This information is not intended to replace advice given to you by your health care provider. Make sure you discuss any questions you have with your health care provider. Document Released:  02/22/2008 Document Revised: 04/22/2016 Document Reviewed: 10/12/2013 Elsevier Interactive Patient Education  2017 [employer].
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
## 2018-03-05 ENCOUNTER — Emergency Department (HOSPITAL_COMMUNITY)
Admission: EM | Admit: 2018-03-05 | Discharge: 2018-03-05 | Disposition: A | Discharge status: Home / Self Care | Payer: Self-pay | Attending: Emergency Medicine | Admitting: Emergency Medicine

## 2018-03-05 ENCOUNTER — Other Ambulatory Visit: Payer: Self-pay

## 2018-03-05 ENCOUNTER — Emergency Department (HOSPITAL_COMMUNITY): Payer: Self-pay

## 2018-03-05 ENCOUNTER — Encounter (HOSPITAL_COMMUNITY): Payer: Self-pay

## 2018-03-05 DIAGNOSIS — K8020 Calculus of gallbladder without cholecystitis without obstruction: Secondary | ICD-10-CM

## 2018-03-05 DIAGNOSIS — F1090 Alcohol use, unspecified, uncomplicated: Secondary | ICD-10-CM

## 2018-03-05 DIAGNOSIS — Z789 Other specified health status: Secondary | ICD-10-CM | POA: Insufficient documentation

## 2018-03-05 DIAGNOSIS — Z7289 Other problems related to lifestyle: Secondary | ICD-10-CM

## 2018-03-05 DIAGNOSIS — N200 Calculus of kidney: Secondary | ICD-10-CM

## 2018-03-05 DIAGNOSIS — Z79899 Other long term (current) drug therapy: Secondary | ICD-10-CM | POA: Insufficient documentation

## 2018-03-05 DIAGNOSIS — R7401 Elevation of levels of liver transaminase levels: Secondary | ICD-10-CM

## 2018-03-05 DIAGNOSIS — R740 Nonspecific elevation of levels of transaminase and lactic acid dehydrogenase [LDH]: Secondary | ICD-10-CM | POA: Insufficient documentation

## 2018-03-05 DIAGNOSIS — F1010 Alcohol abuse, uncomplicated: Secondary | ICD-10-CM | POA: Insufficient documentation

## 2018-03-05 DIAGNOSIS — Z9049 Acquired absence of other specified parts of digestive tract: Secondary | ICD-10-CM | POA: Insufficient documentation

## 2018-03-05 DIAGNOSIS — Z9081 Acquired absence of spleen: Secondary | ICD-10-CM | POA: Insufficient documentation

## 2018-03-05 DIAGNOSIS — F17200 Nicotine dependence, unspecified, uncomplicated: Secondary | ICD-10-CM | POA: Insufficient documentation

## 2018-03-05 DIAGNOSIS — R1032 Left lower quadrant pain: Secondary | ICD-10-CM

## 2018-03-05 LAB — COMPREHENSIVE METABOLIC PANEL
ALK PHOS: 86 U/L (ref 38–126)
ALT: 86 U/L — AB (ref 17–63)
ANION GAP: 13 (ref 5–15)
AST: 75 U/L — ABNORMAL HIGH (ref 15–41)
Albumin: 4.5 g/dL (ref 3.5–5.0)
BUN: 5 mg/dL — ABNORMAL LOW (ref 6–20)
CALCIUM: 9.5 mg/dL (ref 8.9–10.3)
CHLORIDE: 105 mmol/L (ref 101–111)
CO2: 21 mmol/L — AB (ref 22–32)
CREATININE: 0.81 mg/dL (ref 0.61–1.24)
GFR calc Af Amer: >60 mL/min (ref >60)
GFR calc non Af Amer: >60 mL/min (ref >60)
GLUCOSE: 90 mg/dL (ref 65–99)
Potassium: 3.8 mmol/L (ref 3.5–5.1)
SODIUM: 139 mmol/L (ref 135–145)
Total Bilirubin: 1.9 mg/dL — ABNORMAL HIGH (ref 0.3–1.2)
Total Protein: 8.4 g/dL — ABNORMAL HIGH (ref 6.5–8.1)

## 2018-03-05 LAB — URINALYSIS, ROUTINE W REFLEX MICROSCOPIC
Bacteria, UA: NONE SEEN
Bilirubin Urine: NEGATIVE
GLUCOSE, UA: NEGATIVE mg/dL
Hgb urine dipstick: NEGATIVE
KETONES UR: 20 mg/dL — AB
Leukocytes, UA: NEGATIVE
Nitrite: NEGATIVE
PROTEIN: 30 mg/dL — AB
SQUAMOUS EPITHELIAL / LPF: NONE SEEN
Specific Gravity, Urine: >1.046 — ABNORMAL HIGH (ref 1.005–1.030)
pH: 7 (ref 5.0–8.0)

## 2018-03-05 LAB — CBC
HCT: 48.8 % (ref 39.0–52.0)
HEMOGLOBIN: 16.6 g/dL (ref 13.0–17.0)
MCH: 32.2 pg (ref 26.0–34.0)
MCHC: 34 g/dL (ref 30.0–36.0)
MCV: 94.6 fL (ref 78.0–100.0)
Platelets: 181 10*3/uL (ref 150–400)
RBC: 5.16 MIL/uL (ref 4.22–5.81)
RDW: 13.2 % (ref 11.5–15.5)
WBC: 6.4 10*3/uL (ref 4.0–10.5)

## 2018-03-05 LAB — LIPASE, BLOOD: LIPASE: 25 U/L (ref 11–51)

## 2018-03-05 MED ORDER — SODIUM CHLORIDE 0.9 % IV BOLUS
1000.0000 mL | Freq: Once | INTRAVENOUS | Status: AC
  Administered 2018-03-05: 1000 mL via INTRAVENOUS

## 2018-03-05 MED ORDER — IOPAMIDOL (ISOVUE-300) INJECTION 61%
100.0000 mL | Freq: Once | INTRAVENOUS | Status: AC | PRN
  Administered 2018-03-05: 100 mL via INTRAVENOUS

## 2018-03-05 MED ORDER — IOPAMIDOL (ISOVUE-300) INJECTION 61%: 100.0000 mL | Freq: Once | INTRAVENOUS | Status: DC | PRN

## 2018-03-05 MED ORDER — IOPAMIDOL (ISOVUE-300) INJECTION 61%
INTRAVENOUS | Status: AC
  Filled 2018-03-05: qty 100

## 2018-03-05 MED ORDER — KETOROLAC TROMETHAMINE 30 MG/ML IJ SOLN
30.0000 mg | Freq: Once | INTRAMUSCULAR | Status: AC
  Administered 2018-03-05: 30 mg via INTRAVENOUS
  Filled 2018-03-05: qty 1

## 2018-03-05 MED ORDER — MORPHINE SULFATE (PF) 4 MG/ML IV SOLN
4.0000 mg | Freq: Once | INTRAVENOUS | Status: AC
  Administered 2018-03-05: 4 mg via INTRAVENOUS
  Filled 2018-03-05: qty 1

## 2018-03-05 NOTE — ED Notes (Signed)
Pt verbalized understanding discharge instructions and denies any further needs or questions at this time. VS stable, ambulatory and steady gait.   
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
## 2018-03-05 NOTE — ED Notes (Signed)
Pt given a Malaw[place]urkey sandwich and a Coke, per [place], VF CorporationPA-C.
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
## 2018-03-05 NOTE — ED Notes (Signed)
Pt just informed that he is not able eat at this time and he needs to provide a urine sample.  Pt visibly upset; took off his BP cuff and says " I haven't eaten since this morning". Pt informed that he would not be able to eat until surgery is ruled out.

## 2018-03-05 NOTE — Discharge Instructions (Addendum)
Your work up today was very reassuring, your CT scan did not show any evidence of bowel blockage or other concerning findings. You do have a gallstone in your gallbladder, but this is not causing any problems currently. You also have a kidney stone in the right kidney, but this is also not causing any problems at this point. Your liver enzymes are up, probably from the fact that you drink alcohol; try to cut down on your alcohol use. Overall, you pain could be from a muscle strain, or possibly gas pain, or other nonemergent causes. Alternate between tylenol and motrin for pain, stay well hydrated and get plenty of rest. Avoid heavy lifting over 10 pounds. Use warm compresses to the areas of soreness. Follow up with your regular doctor in 1 week for recheck of symptoms. Return to the ER for emergent changes or worsening symptoms.   Abdominal (belly) pain can be caused by many things. Your caregiver performed an examination and possibly ordered blood/urine tests and imaging (CT scan, x-rays, ultrasound). Many cases can be observed and treated at home after initial evaluation in the emergency department. Even though you are being discharged home, abdominal pain can be unpredictable. Therefore, you need a repeated exam if your pain does not resolve, returns, or worsens. Most patients with abdominal pain don't have to be admitted to the hospital or have surgery, but serious problems like appendicitis and gallbladder attacks can start out as nonspecific pain. Many abdominal conditions cannot be diagnosed in one visit, so follow-up evaluations are very important. SEEK IMMEDIATE MEDICAL ATTENTION IF YOU DEVELOP ANY OF THE FOLLOWING SYMPTOMS: The pain does not go away or becomes severe.  A temperature above 101 develops.  Repeated vomiting occurs (multiple episodes).  The pain becomes localized to portions of the abdomen. The right side could possibly be appendicitis. In an adult, the left lower portion of the abdomen  could be colitis or diverticulitis.  Blood is being passed in stools or vomit (bright red or black tarry stools).  Return also if you develop chest pain, difficulty breathing, dizziness or fainting, or become confused, poorly responsive, or inconsolable (young children). The constipation stays for more than 4 days.  There is belly (abdominal) or rectal pain.  You do not seem to be getting better.

## 2018-06-26 ENCOUNTER — Ambulatory Visit: Payer: No Typology Code available for payment source

## 2018-06-26 ENCOUNTER — Other Ambulatory Visit: Payer: Self-pay | Admitting: Pediatrics

## 2018-06-26 DIAGNOSIS — R109 Unspecified abdominal pain: Secondary | ICD-10-CM

## 2018-10-05 ENCOUNTER — Other Ambulatory Visit: Payer: Self-pay | Admitting: Physician Assistant

## 2018-10-05 DIAGNOSIS — M545 Low back pain, unspecified: Secondary | ICD-10-CM

## 2018-10-05 DIAGNOSIS — M542 Cervicalgia: Secondary | ICD-10-CM

## 2018-10-30 ENCOUNTER — Ambulatory Visit
Admission: RE | Admit: 2018-10-30 | Discharge: 2018-10-30 | Disposition: A | Discharge status: Home / Self Care | Payer: Commercial Managed Care - PPO | Source: Ambulatory Visit | Attending: Physician Assistant | Admitting: Physician Assistant

## 2018-10-30 DIAGNOSIS — M542 Cervicalgia: Secondary | ICD-10-CM

## 2018-10-30 DIAGNOSIS — M545 Low back pain, unspecified: Secondary | ICD-10-CM

## 2020-05-08 ENCOUNTER — Other Ambulatory Visit: Payer: Self-pay

## 2020-05-08 ENCOUNTER — Emergency Department (HOSPITAL_COMMUNITY)
Admission: EM | Admit: 2020-05-08 | Discharge: 2020-05-08 | Disposition: A | Discharge status: Home / Self Care | Payer: Commercial Managed Care - PPO | Attending: Emergency Medicine | Admitting: Emergency Medicine

## 2020-05-08 ENCOUNTER — Encounter (HOSPITAL_COMMUNITY): Payer: Self-pay | Admitting: Emergency Medicine

## 2020-05-08 DIAGNOSIS — R1031 Right lower quadrant pain: Secondary | ICD-10-CM | POA: Diagnosis present

## 2020-05-08 DIAGNOSIS — R350 Frequency of micturition: Secondary | ICD-10-CM | POA: Diagnosis not present

## 2020-05-08 DIAGNOSIS — Z5321 Procedure and treatment not carried out due to patient leaving prior to being seen by health care provider: Secondary | ICD-10-CM | POA: Diagnosis not present

## 2020-05-08 LAB — CBC
HCT: 46.6 % (ref 39.0–52.0)
Hemoglobin: 16 g/dL (ref 13.0–17.0)
MCH: 30.4 pg (ref 26.0–34.0)
MCHC: 34.3 g/dL (ref 30.0–36.0)
MCV: 88.4 fL (ref 80.0–100.0)
Platelets: 185 10*3/uL (ref 150–400)
RBC: 5.27 MIL/uL (ref 4.22–5.81)
RDW: 11.6 % (ref 11.5–15.5)
WBC: 8.8 10*3/uL (ref 4.0–10.5)
nRBC: 0 % (ref 0.0–0.2)

## 2020-05-08 LAB — BASIC METABOLIC PANEL
Anion gap: 12 (ref 5–15)
BUN: 13 mg/dL (ref 6–20)
CO2: 22 mmol/L (ref 22–32)
Calcium: 9.3 mg/dL (ref 8.9–10.3)
Chloride: 106 mmol/L (ref 98–111)
Creatinine, Ser: 0.85 mg/dL (ref 0.61–1.24)
GFR calc Af Amer: >60 mL/min (ref >60)
GFR calc non Af Amer: >60 mL/min (ref >60)
Glucose, Bld: 120 mg/dL — ABNORMAL HIGH (ref 70–99)
Potassium: 3.3 mmol/L — ABNORMAL LOW (ref 3.5–5.1)
Sodium: 140 mmol/L (ref 135–145)

## 2020-05-08 LAB — URINALYSIS, ROUTINE W REFLEX MICROSCOPIC
Bilirubin Urine: NEGATIVE
Glucose, UA: NEGATIVE mg/dL
Hgb urine dipstick: NEGATIVE
Ketones, ur: NEGATIVE mg/dL
Leukocytes,Ua: NEGATIVE
Nitrite: NEGATIVE
Protein, ur: NEGATIVE mg/dL
Specific Gravity, Urine: 1.006 (ref 1.005–1.030)
pH: 7 (ref 5.0–8.0)

## 2020-05-08 NOTE — ED Triage Notes (Signed)
Pt c/o right sided groin pain that radiates to his scrotum x 2 months. Pt also c/o urinary frequency.

## 2020-05-08 NOTE — ED Notes (Signed)
Pts stickers were found in trash can. Pts name was also called for registration with no response. Pt LWBS.

## 2020-05-08 NOTE — ED Notes (Signed)
While taking VS pt jerked BP cuff off of arm and stated "I'm leaving I'm not dealing with this shit" and walked off.

## 2020-08-11 ENCOUNTER — Other Ambulatory Visit: Payer: Self-pay | Admitting: Orthopedic Surgery

## 2020-08-11 DIAGNOSIS — M25522 Pain in left elbow: Secondary | ICD-10-CM

## 2020-08-11 DIAGNOSIS — M25122 Fistula, left elbow: Secondary | ICD-10-CM

## 2020-09-02 ENCOUNTER — Other Ambulatory Visit: Payer: Self-pay

## 2020-09-02 ENCOUNTER — Ambulatory Visit
Admission: RE | Admit: 2020-09-02 | Discharge: 2020-09-02 | Disposition: A | Discharge status: Home / Self Care | Payer: Commercial Managed Care - PPO | Source: Ambulatory Visit | Attending: Orthopedic Surgery | Admitting: Orthopedic Surgery

## 2020-09-02 DIAGNOSIS — M25122 Fistula, left elbow: Secondary | ICD-10-CM

## 2020-09-02 DIAGNOSIS — M25522 Pain in left elbow: Secondary | ICD-10-CM

## 2020-09-02 MED ORDER — IOPAMIDOL (ISOVUE-M 200) INJECTION 41%
15.0000 mL | Freq: Once | INTRAMUSCULAR | Status: AC
  Administered 2020-09-02: 15 mL via INTRA_ARTICULAR

## 2020-12-16 ENCOUNTER — Telehealth: Payer: Self-pay | Admitting: Orthopaedic Surgery

## 2020-12-16 NOTE — Telephone Encounter (Signed)
Received medical records request from patient ?

## 2021-01-06 NOTE — Telephone Encounter (Signed)
Received call from pt regarding the release form he completed.He wanted MW Alice Rieger to send [place] their records. IC, lmvm advised he completed auth for [facility] Harness to receive our records. He did not complete it to auth MW to release to [place]. I also advised that MRI's from Dr. Madelon Lips are viewable in our system. Advised he can call our office to schedule appt.

## 2022-02-09 IMAGING — MR MR ELBOW*L* W/O CM
4 of 5 series · 25 of 40 positions shown · non-contrast
Comparison: None.

CLINICAL DATA: No known injury.  Elbow pain.

EXAM:
MRI OF THE LEFT ELBOW WITHOUT CONTRAST
TECHNIQUE: Multiplanar, multisequence MR imaging of the elbow was performed. No
intravenous contrast was administered.

[Series 3: T1 · axial · 3.0mm · 0.23mm/px · z∈[-0,+100]mm · 3 of 33 slices shown]
[im 4/33]
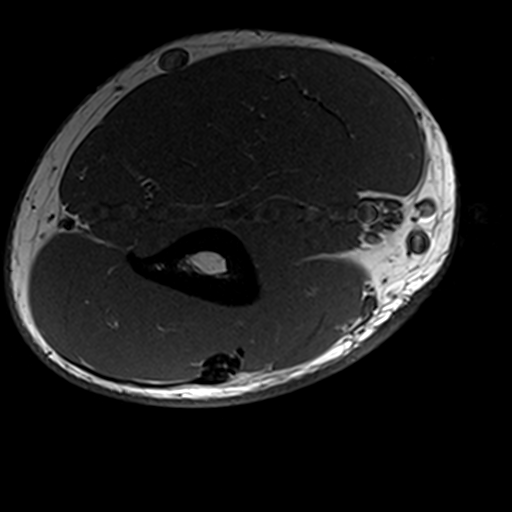
[im 18/33]
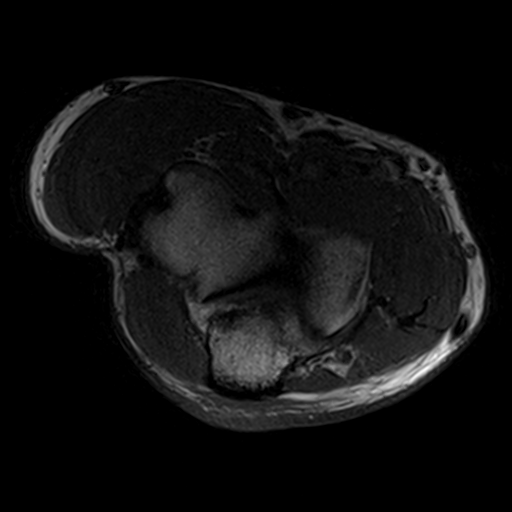
[im 29/33]
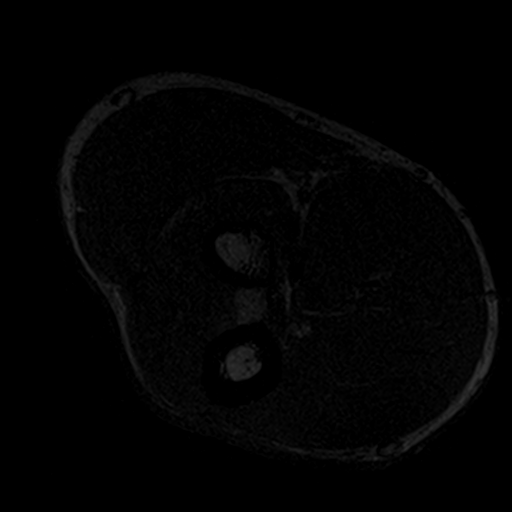

[Series 4: T2 fat-sat · axial · 3.0mm · 0.23mm/px · z∈[-12,+116]mm · 8 of 33 slices shown (1 of 2)]
[im 1/33]
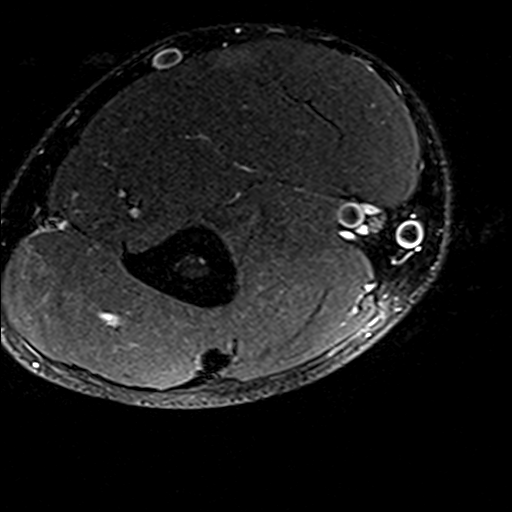
[im 4/33]
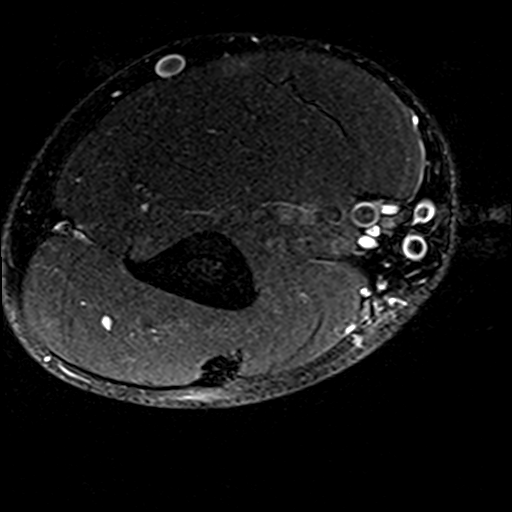
[im 11/33]
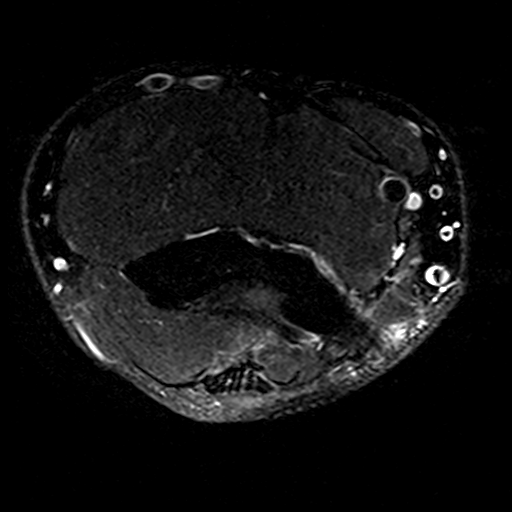
[im 15/33]
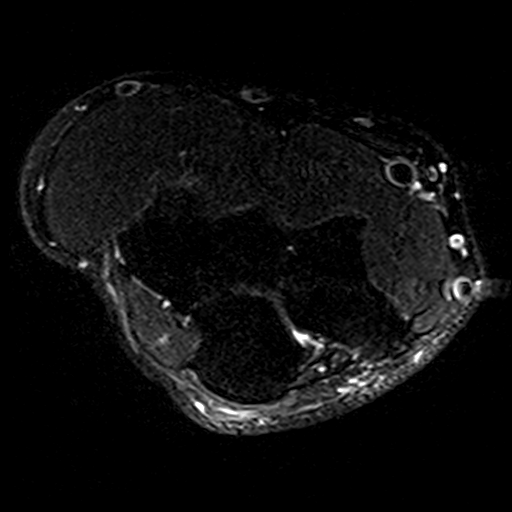
[im 18/33]
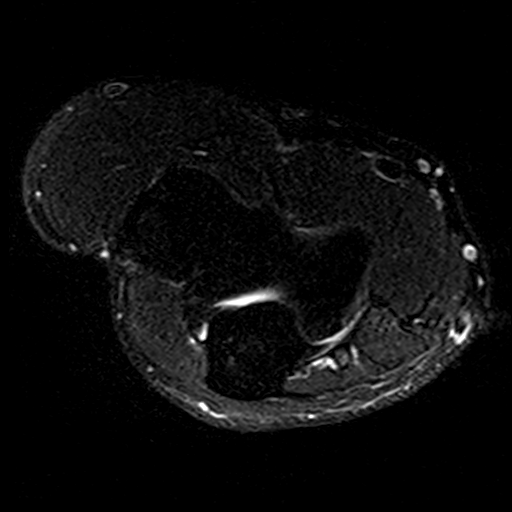
[im 22/33]
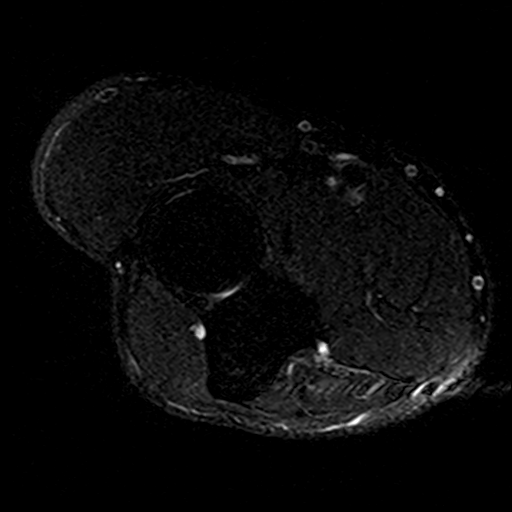
[im 29/33]
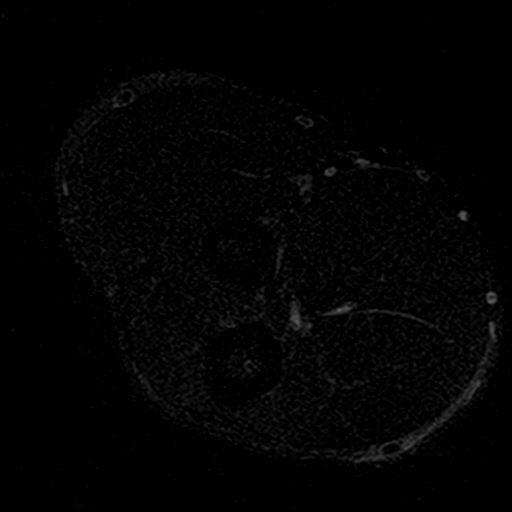
[im 33/33]
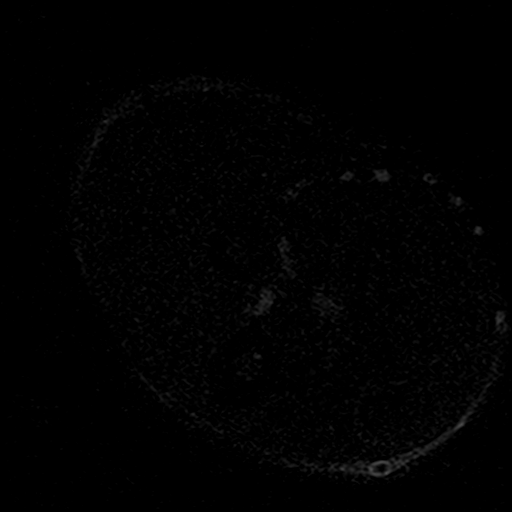

[Series 5: T2 fat-sat · coronal · 3.0mm · 0.27mm/px · 6 of 20 slices shown (2 of 2)]
[im 1/20]
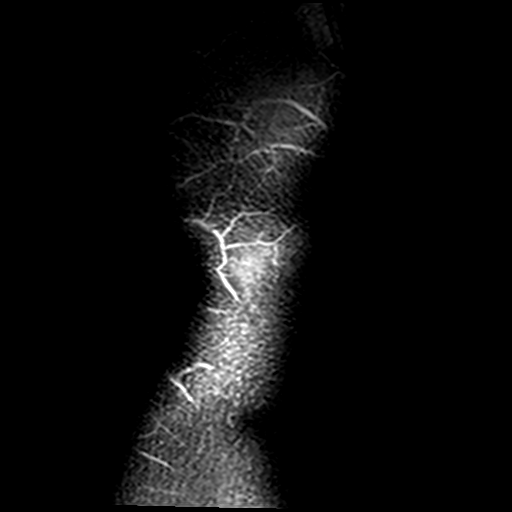
[im 4/20]
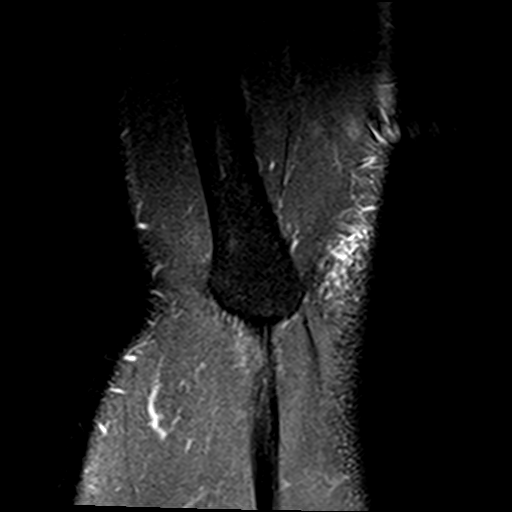
[im 8/20]
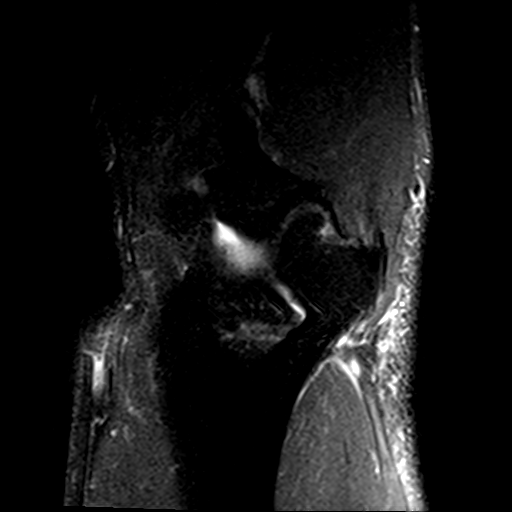
[im 12/20]
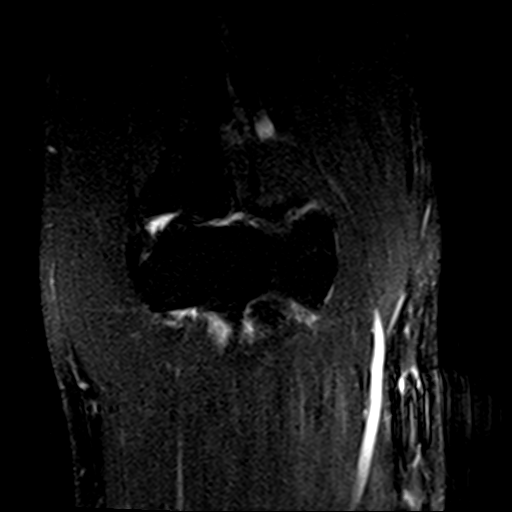
[im 16/20]
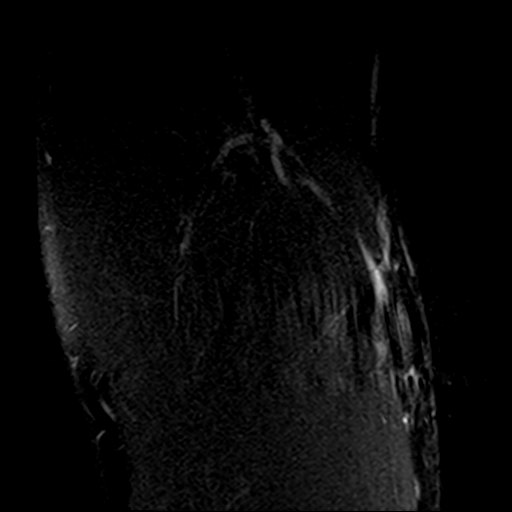
[im 20/20]
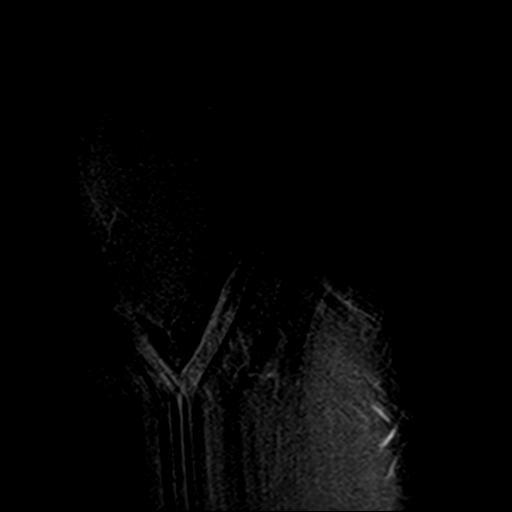

[Series 7: PD fat-sat · sagittal · 3.0mm · 0.55mm/px · 8 of 26 slices shown]
[im 1/26]
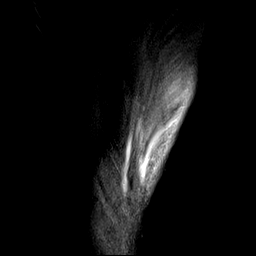
[im 4/26]
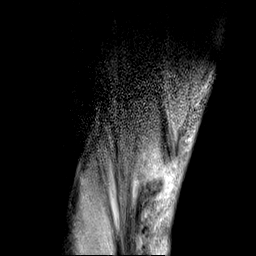
[im 8/26]
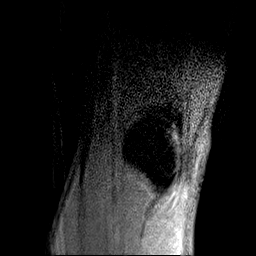
[im 11/26]
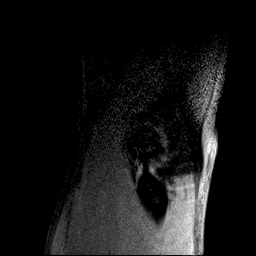
[im 15/26]
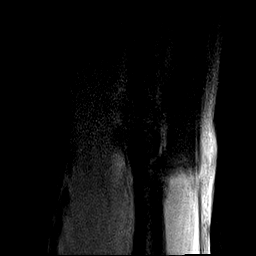
[im 18/26]
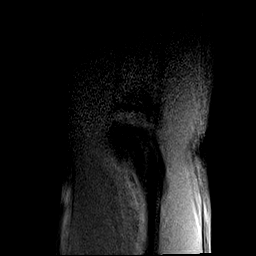
[im 22/26]
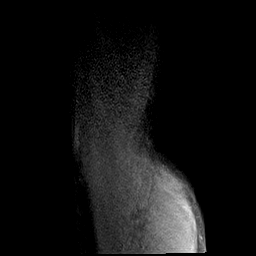
[im 26/26]
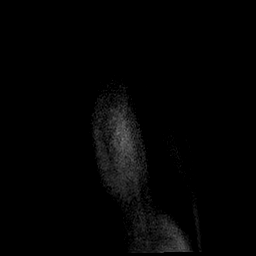

[25 of 40 positions shown; findings below may reference images not displayed]

FINDINGS: Patient motion degrades image quality limiting evaluation.

TENDONS

Common forearm flexor origin: Intact

Common forearm extensor origin: Mild tendinosis of the common
extensor tendon origin.

Biceps: Mild tendinosis of the biceps tendon insertion.

Triceps: Intact

LIGAMENTS

Medial stabilizers: Intact

Lateral stabilizers:  Intact

Cartilage: No chondral defect.

Joint: No joint effusion. No synovitis.

Cubital tunnel: Normal.

Bones: No fracture or dislocation. No marrow abnormality.

Soft Tissues: Muscles are normal. No fluid collection or hematoma.
IMPRESSION: 1. Mild tendinosis of the biceps tendon insertion.
2. Mild tendinosis of the common extensor tendon origin.
